# Patient Record
Sex: Male | Born: 1950 | Race: White | Hispanic: No | Marital: Married | Smoking: Never smoker
Health system: Southern US, Community
[De-identification: ages and names within clinical notes are randomized; demographics above are authoritative.]

## PROBLEM LIST (undated history)

## (undated) DIAGNOSIS — K219 Gastro-esophageal reflux disease without esophagitis: Secondary | ICD-10-CM

## (undated) DIAGNOSIS — N189 Chronic kidney disease, unspecified: Secondary | ICD-10-CM

## (undated) DIAGNOSIS — I10 Essential (primary) hypertension: Secondary | ICD-10-CM

## (undated) DIAGNOSIS — H9312 Tinnitus, left ear: Secondary | ICD-10-CM

## (undated) DIAGNOSIS — M1711 Unilateral primary osteoarthritis, right knee: Secondary | ICD-10-CM

## (undated) DIAGNOSIS — M109 Gout, unspecified: Secondary | ICD-10-CM

## (undated) DIAGNOSIS — M1990 Unspecified osteoarthritis, unspecified site: Secondary | ICD-10-CM

## (undated) DIAGNOSIS — E785 Hyperlipidemia, unspecified: Secondary | ICD-10-CM

## (undated) DIAGNOSIS — C8590 Non-Hodgkin lymphoma, unspecified, unspecified site: Secondary | ICD-10-CM

## (undated) HISTORY — DX: Gout, unspecified: M10.9

## (undated) HISTORY — DX: Hyperlipidemia, unspecified: E78.5

## (undated) HISTORY — PX: TOE SURGERY: SHX1073

## (undated) HISTORY — PX: COLONOSCOPY: SHX174

## (undated) HISTORY — DX: Tinnitus, left ear: H93.12

## (undated) HISTORY — PX: MOUTH SURGERY: SHX715

## (undated) HISTORY — PX: JOINT REPLACEMENT: SHX530

---

## 1996-10-31 DIAGNOSIS — C8590 Non-Hodgkin lymphoma, unspecified, unspecified site: Secondary | ICD-10-CM

## 1996-10-31 HISTORY — DX: Non-Hodgkin lymphoma, unspecified, unspecified site: C85.90

## 2002-07-08 ENCOUNTER — Encounter: Payer: Self-pay | Admitting: Family Medicine

## 2002-07-08 ENCOUNTER — Encounter: Admission: RE | Admit: 2002-07-08 | Discharge: 2002-07-08 | Payer: Self-pay | Admitting: Family Medicine

## 2006-10-31 HISTORY — PX: KNEE ARTHROSCOPY: SHX127

## 2008-08-21 ENCOUNTER — Ambulatory Visit (HOSPITAL_BASED_OUTPATIENT_CLINIC_OR_DEPARTMENT_OTHER): Admission: RE | Admit: 2008-08-21 | Discharge: 2008-08-21 | Payer: Self-pay | Admitting: Orthopedic Surgery

## 2011-03-15 NOTE — Op Note (Signed)
[name], [name]              ACCOUNT NO.:  [id number]   MEDICAL RECORD NO.:  [id number]          PATIENT TYPE:  AMB   LOCATION:  DSC                          FACILITY:  [facility]   PHYSICIAN:  [name], M.D.DATE OF BIRTH:  [birth year]   DATE OF PROCEDURE:  08/21/2008  DATE OF DISCHARGE:                               OPERATIVE REPORT   JUSTIFICATION:  A 60 year old male 2-1/2 to 3 weeks ago was exiting from  his car when he planted his foot, twisted his knee.  He had severe pain  along the medial joint line at that time.  He then marched a number of  miles in [employer] reenactment, had increasing pain and discomfort about  his knee.  On examination, he has slight tension on the lateral joint  line, 1+ effusion.  McMurray testing caused the pain over the medial  joint line.  Flexion past about 110 degrees is quite painful.  Weightbearing film shows mild narrowing of the medial joint line.  An  MRI of the knee shows an oblique tear of the posterior horn of the  medial meniscus with associated parameniscal cyst formation and some  underlying moderate medial compartment degenerative changes.  Some  chondromalacia of the patella was also seen.  Because of persistent pain  and discomfort, he is now admitted for arthroscopic evaluation and  treatment.  Complication discussed preoperatively.  Questions answered  and encouraged.   JUSTIFICATION FOR OUTPATIENT SURGERY:  Minimum morbidity.   PREOPERATIVE DIAGNOSIS:  Tear of posterior horn of medial meniscus,  right knee.   POSTOPERATIVE DIAGNOSES:  Grade 2 chondromalacia, patella, medial facet;  grade 2 and grade 3 cartilage damage of medial femoral condyle, right  knee; tear of posterior horn of medial meniscus, right knee.   OPERATION:  Arthroscopy; chondroplasty of medial patellar facet;  chondroplasty of medial femoral condyle; partial debridement of  posterior horn of medial meniscus, right knee.   SURGEON:  [name].  Mortenson, MD   ANESTHESIA:  MAC, general.   PATHOLOGY:  With arthroscope of the knee, a very careful examination was  undertaken.  There is grade 2 cartilage changes of the medial patella  facet.  The femoral notch area.  Normal as did the ACL.  In the lateral  compartment, there was normal articular cartilage of the lateral femoral  condyle, lateral tibial plateau, and entire circumference of lateral  meniscus was normal.  In the medial compartment, there was a tear of the  posterior horn of the medial meniscus and also some grade 2 and grade 3  cartilage damage off the medial femoral condyle and a trough had been  eroded into the posterior aspect of the medial femoral condyle where the  torn meniscus was abutting the condyle.   PROCEDURE:  The patient was placed on the operating table in supine  position with a pneumatic tourniquet above the right upper thigh.  The  entire right lower extremity was prepped with DuraPrep and draped out in  the usual manner.  An infusion cannula was placed in the superior medial  pouch of the right knee.  Anteromedial and anterolateral portals were  made.  The arthroscope was introduced and the findings were described as  above.  Initially, the arthroscope was inserted and the posterior aspect  of the patella was approached.  A chondroplasty shaver was inserted and  the medial facet was debrided.  The ligamentum mucosa was excised and  the fat pad excised.  Excellent access to the medial compartment was  then achieved.  There was marked fraying and tearing of articular  cartilage over the posterior aspect of the medial femoral condyle and  trough was eroded necessary as noted above, and this was debrided with a  chondroplasty shaver.  Then, a series of baskets were inserted through  both medial and lateral portals and a posterior horn of medial meniscus  was debrided.  This was debrided back to a smooth and stable rim.  The  intra-articular shaver was  introduced.  All debris was removed, and  remaining rim was then smoothed and balanced with nice transition of mid  third of the medial meniscus.  I was very pleased with a surgical  outcome.  Marcaine was placed, and a large bulky pressure dressing  applied, and the patient returned to recovery room in excellent  condition.  Technically, this went extremely well.   DISPOSITION:  1. Percocet for pain.  2. Return to my office on Wednesday and start physical therapy.  3. Usual postop instructions were given.      [name], M.D.  Electronically Signed     RAM/MEDQ  D:  08/21/2008  T:  08/21/2008  Job:  [id number]

## 2011-08-02 LAB — BASIC METABOLIC PANEL
BUN: 11
CO2: 28
Calcium: 9.1
Chloride: 104
Creatinine, Ser: 1.28
GFR calc Af Amer: >60
GFR calc non Af Amer: 58 — ABNORMAL LOW
Glucose, Bld: 101 — ABNORMAL HIGH
Potassium: 3.5
Sodium: 139

## 2011-08-02 LAB — POCT HEMOGLOBIN-HEMACUE: Hemoglobin: 13.2

## 2013-05-08 ENCOUNTER — Telehealth: Payer: Self-pay | Admitting: Family Medicine

## 2013-05-09 NOTE — Telephone Encounter (Signed)
Spoke with pt and he will sch appt and will come in fasting.

## 2013-05-24 ENCOUNTER — Encounter: Payer: Self-pay | Admitting: Family Medicine

## 2013-05-24 ENCOUNTER — Ambulatory Visit (INDEPENDENT_AMBULATORY_CARE_PROVIDER_SITE_OTHER): Payer: BC Managed Care – PPO | Admitting: Family Medicine

## 2013-05-24 VITALS — BP 135/86 | HR 70 | Temp 97.9°F | Wt 192.0 lb

## 2013-05-24 DIAGNOSIS — M1710 Unilateral primary osteoarthritis, unspecified knee: Secondary | ICD-10-CM

## 2013-05-24 DIAGNOSIS — E785 Hyperlipidemia, unspecified: Secondary | ICD-10-CM

## 2013-05-24 DIAGNOSIS — M170 Bilateral primary osteoarthritis of knee: Secondary | ICD-10-CM | POA: Insufficient documentation

## 2013-05-24 DIAGNOSIS — M109 Gout, unspecified: Secondary | ICD-10-CM | POA: Insufficient documentation

## 2013-05-24 LAB — COMPLETE METABOLIC PANEL WITH GFR
ALT: 14 U/L (ref 0–53)
AST: 15 U/L (ref 0–37)
Albumin: 3.9 g/dL (ref 3.5–5.2)
Alkaline Phosphatase: 46 U/L (ref 39–117)
BUN: 11 mg/dL (ref 6–23)
CO2: 26 mEq/L (ref 19–32)
Calcium: 9.2 mg/dL (ref 8.4–10.5)
Chloride: 102 mEq/L (ref 96–112)
Creat: 1.16 mg/dL (ref 0.50–1.35)
GFR, Est African American: 78 mL/min
GFR, Est Non African American: 68 mL/min
Glucose, Bld: 85 mg/dL (ref 70–99)
Potassium: 4.1 mEq/L (ref 3.5–5.3)
Sodium: 137 mEq/L (ref 135–145)
Total Bilirubin: 0.6 mg/dL (ref 0.3–1.2)
Total Protein: 6.7 g/dL (ref 6.0–8.3)

## 2013-05-24 LAB — URIC ACID: Uric Acid, Serum: 4.8 mg/dL (ref 4.0–6.0)

## 2013-05-24 MED ORDER — ALLOPURINOL 300 MG PO TABS: 300.0000 mg | ORAL_TABLET | Freq: Every day | ORAL | Status: DC

## 2013-05-24 MED ORDER — DICLOFENAC SODIUM 1 % TD GEL: 2.0000 g | Freq: Four times a day (QID) | TRANSDERMAL | Status: DC

## 2013-05-24 NOTE — Patient Instructions (Addendum)
      Dr Chanell Nadeau's Recommendations  Diet and Exercise discussed with patient.  For nutrition information, I recommend books:  1).Eat to Live by Dr Joel Fuhrman. 2).Prevent and Reverse Heart Disease by Dr Caldwell Esselstyn. 3) Dr Neal Barnard's Book:  Program to Reverse Diabetes  Exercise recommendations are:  If unable to walk, then the patient can exercise in a chair 3 times a day. By flapping arms like a bird gently and raising legs outwards to the front.  If ambulatory, the patient can go for walks for 30 minutes 3 times a week. Then increase the intensity and duration as tolerated.  Goal is to try to attain exercise frequency to 5 times a week.  If applicable: Best to perform resistance exercises (machines or weights) 2 days a week and cardio type exercises 3 days per week.  

## 2013-05-24 NOTE — Progress Notes (Signed)
Patient ID: [name], male   DOB: April 24, 1951, 62 y.o.   MRN: [id number] SUBJECTIVE: CC: Chief Complaint  Patient presents with  . Follow-up    needs labs and refills wants 90 supply  fasting  HPI: Patient is here for follow up of hyperlipidemia/gout: denies Headache;denies Chest Pain;denies weakness;denies Shortness of Breath and orthopnea;denies Visual changes;denies palpitations;denies cough;denies pedal edema;denies symptoms of TIA or stroke;deniesClaudication symptoms. admits to Compliance with medications; denies Problems with medications. Numbness in the thumbs.  Past Medical History  Diagnosis Date  . Gout   . Tinnitus of left ear   . Dyslipidemia    No past surgical history on file. History   Social History  . Marital Status: Single    Spouse Name: N/A    Number of Children: N/A  . Years of Education: N/A   Occupational History  . Not on file.   Social History Main Topics  . Smoking status: Never Smoker   . Smokeless tobacco: Not on file  . Alcohol Use: Not on file  . Drug Use: Not on file  . Sexually Active: Not on file   Other Topics Concern  . Not on file   Social History Narrative  . No narrative on file   No family history on file. No current outpatient prescriptions on file prior to visit.   No current facility-administered medications on file prior to visit.   Allergies  Allergen Reactions  . Indomethacin     ANXIOIUS    There is no immunization history on file for this patient. Prior to Admission medications   Medication Sig Start Date End Date Taking? Authorizing Provider  allopurinol (ZYLOPRIM) 300 MG tablet Take 300 mg by mouth daily.  04/05/13  Yes Historical Provider, MD  lovastatin (MEVACOR) 40 MG tablet Take 40 mg by mouth at bedtime.  03/12/13  Yes Historical Provider, MD     ROS: As above in the HPI. All other systems are stable or negative.  OBJECTIVE: APPEARANCE:  Patient in no acute distress.The patient appeared  well nourished and normally developed. Acyanotic. Waist: VITAL SIGNS:BP 135/86  Pulse 70  Temp(Src) 97.9 F (36.6 C) (Oral)  Wt 192 lb (87.091 kg) WM  SKIN: warm and  Dry without overt rashes, tattoos and scars  HEAD and Neck: without JVD, Head and scalp: normal Eyes:No scleral icterus. Fundi normal, eye movements normal. Ears: Auricle normal, canal normal, Tympanic membranes normal, insufflation normal. Nose: normal Throat: normal Neck & thyroid: normal  CHEST & LUNGS: Chest wall: normal Lungs: Clear  CVS: Reveals the PMI to be normally located. Regular rhythm, First and Second Heart sounds are normal,  absence of murmurs, rubs or gallops. Peripheral vasculature: Radial pulses: normal Dorsal pedis pulses: normal Posterior pulses: normal  ABDOMEN:  Appearance: normal Benign, no organomegaly, no masses, no Abdominal Aortic enlargement. No Guarding , no rebound. No Bruits. Bowel sounds: normal  RECTAL: N/A GU: N/A  EXTREMETIES: nonedematous. Both Femoral and Pedal pulses are normal.  MUSCULOSKELETAL:  Spine: normal Joints: knees crepitus and mild pain.  NEUROLOGIC: oriented to time,place and person; nonfocal. Strength is normal Sensory is normal Reflexes are normal Cranial Nerves are normal.  ASSESSMENT: HLD (hyperlipidemia) - Plan: COMPLETE METABOLIC PANEL WITH GFR, NMR Lipoprofile with Lipids  Gout - Plan: allopurinol (ZYLOPRIM) 300 MG tablet, COMPLETE METABOLIC PANEL WITH GFR, Uric acid  Arthritis knees  PLAN: Orders Placed This Encounter  Procedures  . COMPLETE METABOLIC PANEL WITH GFR  . NMR Lipoprofile with Lipids  .  Uric acid   Meds ordered this encounter  Medications  . DISCONTD: allopurinol (ZYLOPRIM) 300 MG tablet    Sig: Take 300 mg by mouth daily.   Marland Kitchen lovastatin (MEVACOR) 40 MG tablet    Sig: Take 40 mg by mouth at bedtime.   Marland Kitchen allopurinol (ZYLOPRIM) 300 MG tablet    Sig: Take 1 tablet (300 mg total) by mouth daily.    Dispense:   90 tablet    Refill:  3  . diclofenac sodium (VOLTAREN) 1 % GEL    Sig: Apply 2 g topically 4 (four) times daily.    Dispense:  100 g    Refill:  3        Dr Woodroe Mode Recommendations  Diet and Exercise discussed with patient.  For nutrition information, I recommend books:  1).Eat to Live by Dr Monico Hoar. 2).Prevent and Reverse Heart Disease by Dr Suzzette Righter. 3) Dr Katherina Right Book:  Program to Reverse Diabetes  Exercise recommendations are:  If unable to walk, then the patient can exercise in a chair 3 times a day. By flapping arms like a bird gently and raising legs outwards to the front.  If ambulatory, the patient can go for walks for 30 minutes 3 times a week. Then increase the intensity and duration as tolerated.  Goal is to try to attain exercise frequency to 5 times a week.  If applicable: Best to perform resistance exercises (machines or weights) 2 days a week and cardio type exercises 3 days per week.  Return in about 3 months (around 08/24/2013) for Recheck medical problems.  Karanvir Balderston P. Modesto Charon, M.D.

## 2013-05-27 LAB — NMR LIPOPROFILE WITH LIPIDS
Cholesterol, Total: 131 mg/dL (ref <200)
HDL Particle Number: 30.6 umol/L (ref >=30.5)
HDL Size: 8.8 nm — ABNORMAL LOW (ref >=9.2)
HDL-C: 41 mg/dL (ref >=40)
LDL (calc): 73 mg/dL (ref <100)
LDL Particle Number: 998 nmol/L (ref <1000)
LDL Size: 20.3 nm — ABNORMAL LOW (ref >20.5)
LP-IR Score: 37 (ref <=45)
Large HDL-P: 4 umol/L — ABNORMAL LOW (ref >=4.8)
Large VLDL-P: <0.8 nmol/L (ref <=2.7)
Small LDL Particle Number: 504 nmol/L (ref <=527)
Triglycerides: 86 mg/dL (ref <150)
VLDL Size: 43.1 nm (ref <=46.6)

## 2013-05-29 NOTE — Progress Notes (Signed)
Quick Note:  Lab result at goal. No change in Medications for now. No Change in plans and follow up. ______ 

## 2013-07-22 ENCOUNTER — Telehealth: Payer: Self-pay | Admitting: Family Medicine

## 2013-07-22 ENCOUNTER — Other Ambulatory Visit: Payer: Self-pay | Admitting: Family Medicine

## 2013-07-22 DIAGNOSIS — M109 Gout, unspecified: Secondary | ICD-10-CM

## 2013-07-22 MED ORDER — ALLOPURINOL 300 MG PO TABS: 300.0000 mg | ORAL_TABLET | Freq: Every day | ORAL | Status: DC

## 2013-07-22 NOTE — Telephone Encounter (Signed)
Prescription renewed in [facility]IC. 

## 2013-07-22 NOTE — Telephone Encounter (Signed)
Pt requesting a refill of allopurinol.  He is out.

## 2013-07-23 NOTE — Telephone Encounter (Signed)
Pt notified that rx called to [facility]

## 2013-08-29 ENCOUNTER — Ambulatory Visit: Payer: BC Managed Care – PPO | Admitting: Family Medicine

## 2014-01-29 ENCOUNTER — Other Ambulatory Visit: Payer: Self-pay | Admitting: Family Medicine

## 2014-01-29 NOTE — Telephone Encounter (Signed)
Patient needs to be seen. Has exceeded time since last visit. Limited quantity refilled. Needs to bring all medications to next appointment.   

## 2014-01-29 NOTE — Telephone Encounter (Signed)
Last seen 04/2013 

## 2014-01-30 ENCOUNTER — Telehealth: Payer: Self-pay | Admitting: *Deleted

## 2014-01-30 NOTE — Telephone Encounter (Signed)
Appt scheduled for 03/03/14. Can you authorize one refill to last until then?

## 2014-01-30 NOTE — Telephone Encounter (Signed)
Patient aware,NTBS and script ready.

## 2014-02-03 ENCOUNTER — Telehealth: Payer: Self-pay | Admitting: Family Medicine

## 2014-02-04 NOTE — Telephone Encounter (Signed)
SPOKE WITH PT . HE HAS APPT 03-04-14 WITH [facility] AND WANTS TO CHANGE DRUG STORE FROM [facility]. PT REQUESTING HIS ALLOPURINOL BE CALLED THERE. RX CALLED TO [facility] -- PT AWARE

## 2014-02-27 ENCOUNTER — Ambulatory Visit: Payer: BC Managed Care – PPO | Admitting: Family Medicine

## 2014-03-04 ENCOUNTER — Ambulatory Visit: Payer: BC Managed Care – PPO | Admitting: Family Medicine

## 2014-03-04 ENCOUNTER — Telehealth: Payer: Self-pay | Admitting: Family Medicine

## 2014-03-05 NOTE — Telephone Encounter (Signed)
Patient has appt scheduled with Tonita Cong for May 18.  He is out of gout medication and would like a refill to hold him until appt.

## 2014-03-06 ENCOUNTER — Other Ambulatory Visit: Payer: Self-pay | Admitting: Family Medicine

## 2014-03-06 MED ORDER — ALLOPURINOL 300 MG PO TABS: ORAL_TABLET | ORAL | Status: DC

## 2014-03-06 NOTE — Telephone Encounter (Signed)
Call patient : Prescription refilled & sent to pharmacy in [facility]. 

## 2014-03-06 NOTE — Telephone Encounter (Signed)
Aware of script done.

## 2014-03-17 ENCOUNTER — Encounter: Payer: Self-pay | Admitting: Physician Assistant

## 2014-03-17 ENCOUNTER — Ambulatory Visit (INDEPENDENT_AMBULATORY_CARE_PROVIDER_SITE_OTHER): Payer: BC Managed Care – PPO | Admitting: Physician Assistant

## 2014-03-17 VITALS — BP 145/96 | HR 81 | Temp 98.0°F | Ht 71.0 in | Wt 194.6 lb

## 2014-03-17 DIAGNOSIS — E785 Hyperlipidemia, unspecified: Secondary | ICD-10-CM

## 2014-03-17 DIAGNOSIS — M109 Gout, unspecified: Secondary | ICD-10-CM

## 2014-03-17 MED ORDER — ALLOPURINOL 300 MG PO TABS: ORAL_TABLET | ORAL | Status: DC

## 2014-03-17 NOTE — Patient Instructions (Signed)

## 2014-03-17 NOTE — Progress Notes (Signed)
Subjective:     Patient ID: [name], male   DOB: Jul 08, 1951, 63 y.o.   MRN: [id number]  HPI Pt with chronic hx of gout and hyperlipid He has done well with his gout No attacks since starting his Allopurinol He does have some prox thumb pain bilat Pt prev on lipid meds but dc'd due to joint pain He has tried to manage through diet and exercise  Review of Systems Denies any SOB/CP He has lost 15 lbs through diet and exercise No other joint pain at this time     Objective:   Physical Exam No cerv nodes Heart- RRR w/o M Lungs- CTA bilat Abd- soft, NT/ND, no masses/HSM    Assessment:     Gout Hyperlipid    Plan:     BMP, Lipid/liver done today Will inform of lab results Allopurinol was rf'd today Pt to monitor BP Pt to f/u for PE in August for regular health maint

## 2014-03-18 LAB — LIPID PANEL
Chol/HDL Ratio: 4.4 ratio units (ref 0.0–5.0)
Cholesterol, Total: 186 mg/dL (ref 100–199)
HDL: 42 mg/dL (ref >39)
LDL Calculated: 123 mg/dL — ABNORMAL HIGH (ref 0–99)
Triglycerides: 106 mg/dL (ref 0–149)
VLDL Cholesterol Cal: 21 mg/dL (ref 5–40)

## 2014-03-18 LAB — HEPATIC FUNCTION PANEL
ALBUMIN: 4.5 g/dL (ref 3.6–4.8)
ALK PHOS: 48 IU/L (ref 39–117)
ALT: 18 IU/L (ref 0–44)
AST: 19 IU/L (ref 0–40)
BILIRUBIN DIRECT: 0.12 mg/dL (ref 0.00–0.40)
BILIRUBIN TOTAL: 0.5 mg/dL (ref 0.0–1.2)
TOTAL PROTEIN: 6.6 g/dL (ref 6.0–8.5)

## 2014-03-18 LAB — BMP8+EGFR
BUN/Creatinine Ratio: 16 (ref 10–22)
BUN: 20 mg/dL (ref 8–27)
CALCIUM: 9.2 mg/dL (ref 8.6–10.2)
CHLORIDE: 105 mmol/L (ref 97–108)
CO2: 23 mmol/L (ref 18–29)
Creatinine, Ser: 1.27 mg/dL (ref 0.76–1.27)
GFR calc Af Amer: 70 mL/min/{1.73_m2} (ref >59)
GFR calc non Af Amer: 60 mL/min/{1.73_m2} (ref >59)
GLUCOSE: 94 mg/dL (ref 65–99)
POTASSIUM: 4.2 mmol/L (ref 3.5–5.2)
SODIUM: 143 mmol/L (ref 134–144)

## 2014-03-27 ENCOUNTER — Telehealth: Payer: Self-pay | Admitting: Physician Assistant

## 2014-04-01 MED ORDER — ALLOPURINOL 300 MG PO TABS: ORAL_TABLET | ORAL | Status: DC

## 2014-04-01 NOTE — Telephone Encounter (Signed)
rx snet in but only 6 months worth of refill

## 2014-05-12 ENCOUNTER — Telehealth: Payer: Self-pay | Admitting: Physician Assistant

## 2014-05-12 NOTE — Telephone Encounter (Signed)
Discussed with patient. Appt scheduled with Dr. Sabra Heck for 05/16/14.

## 2014-05-12 NOTE — Telephone Encounter (Signed)
Uric acid level was normal- not sure was gout- NTBS

## 2014-05-12 NOTE — Telephone Encounter (Signed)
Pt having bilateral hand pain. Was told it was gout but Allopurinol not helping. Tylenol not helping. Wants something for pain or what to do next. Having a hard time grasping with thumb. [employer], [place].

## 2014-05-16 ENCOUNTER — Encounter: Payer: Self-pay | Admitting: Family Medicine

## 2014-05-16 ENCOUNTER — Ambulatory Visit (INDEPENDENT_AMBULATORY_CARE_PROVIDER_SITE_OTHER): Payer: BC Managed Care – PPO | Admitting: Family Medicine

## 2014-05-16 VITALS — BP 130/77 | HR 81 | Temp 97.3°F | Ht 71.0 in | Wt 192.6 lb

## 2014-05-16 DIAGNOSIS — M654 Radial styloid tenosynovitis [de Quervain]: Secondary | ICD-10-CM

## 2014-05-16 MED ORDER — DICLOFENAC SODIUM 75 MG PO TBEC: 75.0000 mg | DELAYED_RELEASE_TABLET | Freq: Two times a day (BID) | ORAL | Status: DC

## 2014-05-16 NOTE — Progress Notes (Signed)
   Subjective:    Patient ID: [name], male    DOB: Aug 27, 1951, 63 y.o.   MRN: [id number]  HPI is a 63 year old male here today with bilateral thumb pain. 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 He has a history of gout and wonders if a gallop could be because of his thumb pain. There is been no erythema or increased temperature in the area. There is no history of injury He has not had a flareup of gout since he was placed on allopurinol sometime in the past. He has a prescription for diclofenac but has not tried that for his thumb pain    Review of Systems  Constitutional: Negative.   HENT: Negative.   Eyes: Negative.   Respiratory: Negative.   Gastrointestinal: Negative.   Endocrine: Negative.   Genitourinary: Negative.   Musculoskeletal: Negative.   Neurological: Negative.   Hematological: Negative.   Psychiatric/Behavioral: Negative.        Objective:   Physical Exam  Musculoskeletal:  There is no deformity of the thumbs nor is there swelling. There is general tenderness at the base of each thumb in the area of the extensor tendons or anatomic snuffbox. Phelan and Finkelstein's tests are unremarkable          Assessment & Plan:   Thumb pain. He I suspect this pain is a variation of radial tenosynovitis or Tennis Must Quervain's this may require a thumb spica splints but he wanted to try the diclofenac that he arty hands for one week prior to his use of splints. I did show him how he could take the thumbs to splint him and he may or may not try this

## 2014-05-16 NOTE — Patient Instructions (Signed)
De Quervain's Disease °De Quervain's disease is a condition often seen in racquet sports where there is a soreness (inflammation) in the cord like structures (tendons) which attach muscle to bone on the thumb side of the wrist. There may be a tightening of the tissuesaround the tendons. This condition is often helped by giving up or modifying the activity which caused it. When conservative treatment does not help, surgery may be required. Conservative treatment could include changes in the activity which brought about the problem or made it worse. Anti-inflammatory medications and injections may be used to help decrease the inflammation and help with pain control. Your caregiver will help you determine which is best for you. °DIAGNOSIS  °Often the diagnosis (learning what is wrong) can be made by examination. Sometimes x-rays are required. °HOME CARE INSTRUCTIONS  °· Apply ice to the sore area for 15-20 minutes, 03-04 times per day while awake. Put the ice in a plastic bag and place a towel between the bag of ice and your skin. This is especially helpful if it can be done after all activities involving the sore wrist. °· Temporary splinting may help. °· Only take over-the-counter or prescription medicines for pain, discomfort or fever as directed by your caregiver. °SEEK MEDICAL CARE IF:  °· Pain relief is not obtained with medications, or if you have increasing pain and seem to be getting worse rather than better. °MAKE SURE YOU:  °· Understand these instructions. °· Will watch your condition. °· Will get help right away if you are not doing well or get worse. °Document Released: 07/12/2001 Document Revised: 01/09/2012 Document Reviewed: 10/17/2005 °ExitCare® Patient Information ©2015 [employer]. This information is not intended to replace advice given to you by your health care provider. Make sure you discuss any questions you have with your health care provider. ° °

## 2014-10-14 ENCOUNTER — Other Ambulatory Visit: Payer: Self-pay | Admitting: Nurse Practitioner

## 2014-11-04 ENCOUNTER — Telehealth: Payer: Self-pay | Admitting: Family Medicine

## 2014-11-04 NOTE — Telephone Encounter (Signed)
Advised to practice standard practices like hand washing,  disinfecting  and clorox wipes, etc.  Refrain from extreme close contact.

## 2014-11-12 ENCOUNTER — Other Ambulatory Visit: Payer: Self-pay | Admitting: Family Medicine

## 2014-11-13 ENCOUNTER — Other Ambulatory Visit: Payer: Self-pay | Admitting: Family Medicine

## 2014-11-13 NOTE — Telephone Encounter (Signed)
Appointment given for tomorrow at [place] with [facility].

## 2014-11-14 ENCOUNTER — Ambulatory Visit: Payer: Self-pay | Admitting: Family Medicine

## 2014-12-05 ENCOUNTER — Ambulatory Visit: Payer: Self-pay | Admitting: Family Medicine

## 2014-12-05 ENCOUNTER — Other Ambulatory Visit: Payer: Self-pay | Admitting: *Deleted

## 2014-12-05 MED ORDER — ALLOPURINOL 300 MG PO TABS: 300.0000 mg | ORAL_TABLET | Freq: Every day | ORAL | Status: DC

## 2014-12-05 NOTE — Progress Notes (Signed)
Pt cancelled appt in error appt rescheduled Med ok per WDS x 1

## 2014-12-11 ENCOUNTER — Other Ambulatory Visit: Payer: Self-pay | Admitting: Family Medicine

## 2014-12-11 MED ORDER — ALLOPURINOL 300 MG PO TABS: 300.0000 mg | ORAL_TABLET | Freq: Every day | ORAL | Status: DC

## 2014-12-11 NOTE — Telephone Encounter (Signed)
done

## 2014-12-29 ENCOUNTER — Telehealth: Payer: Self-pay | Admitting: Family Medicine

## 2014-12-29 NOTE — Telephone Encounter (Signed)
Last uric acid level was drawn 04/2013.  Is it ok to refill Allopurinol for 90 day supply. Patient won't have insurance coverage after today.

## 2014-12-30 MED ORDER — ALLOPURINOL 300 MG PO TABS: 300.0000 mg | ORAL_TABLET | Freq: Every day | ORAL | Status: DC

## 2014-12-30 NOTE — Telephone Encounter (Signed)
Pt aware.

## 2015-01-15 ENCOUNTER — Ambulatory Visit (INDEPENDENT_AMBULATORY_CARE_PROVIDER_SITE_OTHER): Payer: Self-pay | Admitting: Family Medicine

## 2015-01-15 ENCOUNTER — Encounter: Payer: Self-pay | Admitting: Family Medicine

## 2015-01-15 ENCOUNTER — Encounter (INDEPENDENT_AMBULATORY_CARE_PROVIDER_SITE_OTHER): Payer: Self-pay

## 2015-01-15 VITALS — BP 138/88 | HR 85 | Temp 97.3°F | Ht 71.0 in | Wt 201.0 lb

## 2015-01-15 DIAGNOSIS — M25561 Pain in right knee: Secondary | ICD-10-CM

## 2015-01-15 DIAGNOSIS — Z1211 Encounter for screening for malignant neoplasm of colon: Secondary | ICD-10-CM

## 2015-01-15 MED ORDER — ALLOPURINOL 300 MG PO TABS: 300.0000 mg | ORAL_TABLET | Freq: Every day | ORAL | Status: DC

## 2015-01-15 NOTE — Progress Notes (Signed)
   Subjective:    Patient ID: [name], male    DOB: 15-Apr-1951, 64 y.o.   MRN: [id number]  HPI  64 year old gentleman who complains today of pain in his right knee and right lateral upper leg. He believes that he is compensating for knee pain by changing his gait and that internal causes the pain in the area of the greater trochanter. I am inclined to agree with this assessment.  Patient Active Problem List   Diagnosis Date Noted  . Arthritis of both knees 05/24/2013  . Gout 05/24/2013  . HLD (hyperlipidemia) 05/24/2013   Outpatient Encounter Prescriptions as of 01/15/2015  Medication Sig  . allopurinol (ZYLOPRIM) 300 MG tablet Take 1 tablet (300 mg total) by mouth daily.  . [DISCONTINUED] diclofenac (VOLTAREN) 75 MG EC tablet Take 1 tablet (75 mg total) by mouth 2 (two) times daily.  . [DISCONTINUED] diclofenac sodium (VOLTAREN) 1 % GEL Apply 2 g topically 4 (four) times daily.      Review of Systems  Constitutional: Negative.   HENT: Negative.   Respiratory: Negative.   Cardiovascular: Negative.   Gastrointestinal: Negative.   Musculoskeletal: Positive for arthralgias.  Psychiatric/Behavioral: Negative.        Objective:   Physical Exam  Constitutional: He appears well-developed and well-nourished.  Cardiovascular: Normal rate and regular rhythm.   Pulmonary/Chest: Effort normal.  Musculoskeletal:  Right knee shows some effusion but there is no joint line tenderness or instability to stress test.  There is tenderness in the area of the greater trochanter but again I think this relates to the knee pain and alteration of gait. Point of maximal tenderness was injected with combination of Marcaine and Depo-Medrol.  Neurological: He is alert.    BP 138/88 mmHg  Pulse 85  Temp(Src) 97.3 F (36.3 C) (Oral)  Ht 5\' 11"  (1.803 m)  Wt 201 lb (91.173 kg)  BMI 28.05 kg/m2       Assessment & Plan:  1. Screening for colon cancer  - Ambulatory referral to  Gastroenterology  2. Knee pain, acute, right Suspect there are degenerative changes within the joint. We talked about some quad exercises and use of anti-inflammatories  Wardell Honour MD

## 2015-02-20 ENCOUNTER — Encounter: Payer: Self-pay | Admitting: Gastroenterology

## 2015-04-23 ENCOUNTER — Other Ambulatory Visit: Payer: Self-pay | Admitting: Family Medicine

## 2015-04-23 ENCOUNTER — Telehealth: Payer: Self-pay | Admitting: Family Medicine

## 2015-04-29 ENCOUNTER — Other Ambulatory Visit: Payer: Self-pay | Admitting: Family Medicine

## 2015-04-29 MED ORDER — ALLOPURINOL 300 MG PO TABS: 300.0000 mg | ORAL_TABLET | Freq: Every day | ORAL | Status: DC

## 2015-04-29 NOTE — Telephone Encounter (Signed)
Pt notified & Rx sent in to pharmacy 

## 2015-10-09 ENCOUNTER — Encounter: Payer: Self-pay | Admitting: Family Medicine

## 2015-10-09 ENCOUNTER — Ambulatory Visit (INDEPENDENT_AMBULATORY_CARE_PROVIDER_SITE_OTHER): Payer: Self-pay | Admitting: Family Medicine

## 2015-10-09 VITALS — BP 136/83 | HR 81 | Temp 97.4°F | Ht 71.0 in | Wt 196.2 lb

## 2015-10-09 DIAGNOSIS — M25551 Pain in right hip: Secondary | ICD-10-CM

## 2015-10-09 DIAGNOSIS — Z23 Encounter for immunization: Secondary | ICD-10-CM

## 2015-10-09 DIAGNOSIS — E785 Hyperlipidemia, unspecified: Secondary | ICD-10-CM

## 2015-10-09 MED ORDER — ALLOPURINOL 300 MG PO TABS: 300.0000 mg | ORAL_TABLET | Freq: Every day | ORAL | Status: DC

## 2015-10-09 NOTE — Progress Notes (Signed)
   Subjective:    Patient ID: [name], male    DOB: Mar 07, 1951, 64 y.o.   MRN: [id number]  HPI patient presents today for a physical. He has several symptoms. 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 This was treated at [facility] several years ago.  He also complains of some pain in his ankles. As we talked about the problem is sounds like this may be related to gait and I have suggested some foot orthotics depending on the aware of his shoe. He has on new shoes today so it's not possible to make a real judgment but it sounds like he is walking on the inside of his shoes. He does have a history of gout and I cannot rule out some chronic gout as a source of his ankle pain either. He takes allopurinol and has not really had a full-blown gout attack since being on it.  Patient Active Problem List   Diagnosis Date Noted  . Arthritis of both knees 05/24/2013  . Gout 05/24/2013  . HLD (hyperlipidemia) 05/24/2013   Outpatient Encounter Prescriptions as of 10/09/2015  Medication Sig  . allopurinol (ZYLOPRIM) 300 MG tablet Take 1 tablet (300 mg total) by mouth daily.   No facility-administered encounter medications on file as of 10/09/2015.      Review of Systems  Constitutional: Negative.   Respiratory: Negative.   Cardiovascular: Positive for chest pain.  Genitourinary: Negative.   Musculoskeletal: Positive for arthralgias.  Neurological: Negative.        Objective:   Physical Exam  Constitutional: He is oriented to person, place, and time. He appears well-developed and well-nourished.  HENT:  Head: Normocephalic.  Cardiovascular: Normal rate, regular rhythm and normal heart sounds.   Pulmonary/Chest: Effort normal and breath sounds normal.  Abdominal: Soft. There is no tenderness.  Genitourinary: Prostate normal.  Musculoskeletal:  There is tenderness in the right great trochanter. This was previously injected with some  success. After prepping the skin with alcohol area of maximal tenderness was injected again with combination Marcaine and Depo-Medrol which she tolerated well.  Neurological: He is alert and oriented to person, place, and time.  Psychiatric: He has a normal mood and affect. His behavior is normal.          Assessment & Plan:  1. HLD (hyperlipidemia) Patient has not had blood work in 5 years will screen for lipids and sugar as well as do PSA - CMP14+EGFR - Lipid panel - PSA, total and free  2. Encounter for immunization Flu shot given

## 2015-10-10 LAB — PSA, TOTAL AND FREE
PROSTATE SPECIFIC AG, SERUM: 1.8 ng/mL (ref 0.0–4.0)
PSA FREE: 0.54 ng/mL
PSA, Free Pct: 30 %

## 2015-10-10 LAB — LIPID PANEL
Chol/HDL Ratio: 4.3 ratio units (ref 0.0–5.0)
Cholesterol, Total: 194 mg/dL (ref 100–199)
HDL: 45 mg/dL (ref >39)
LDL CALC: 136 mg/dL — AB (ref 0–99)
Triglycerides: 67 mg/dL (ref 0–149)
VLDL CHOLESTEROL CAL: 13 mg/dL (ref 5–40)

## 2015-10-10 LAB — CMP14+EGFR
ALBUMIN: 4.1 g/dL (ref 3.6–4.8)
ALT: 16 IU/L (ref 0–44)
AST: 14 IU/L (ref 0–40)
Albumin/Globulin Ratio: 1.7 (ref 1.1–2.5)
Alkaline Phosphatase: 50 IU/L (ref 39–117)
BILIRUBIN TOTAL: 0.5 mg/dL (ref 0.0–1.2)
BUN / CREAT RATIO: 14 (ref 10–22)
BUN: 15 mg/dL (ref 8–27)
CHLORIDE: 102 mmol/L (ref 97–106)
CO2: 25 mmol/L (ref 18–29)
CREATININE: 1.11 mg/dL (ref 0.76–1.27)
Calcium: 9 mg/dL (ref 8.6–10.2)
GFR calc non Af Amer: 70 mL/min/{1.73_m2} (ref >59)
GFR, EST AFRICAN AMERICAN: 81 mL/min/{1.73_m2} (ref >59)
GLOBULIN, TOTAL: 2.4 g/dL (ref 1.5–4.5)
GLUCOSE: 101 mg/dL — AB (ref 65–99)
Potassium: 4.4 mmol/L (ref 3.5–5.2)
SODIUM: 140 mmol/L (ref 136–144)
TOTAL PROTEIN: 6.5 g/dL (ref 6.0–8.5)

## 2015-10-23 ENCOUNTER — Telehealth: Payer: Self-pay | Admitting: Family Medicine

## 2015-10-23 MED ORDER — ALLOPURINOL 300 MG PO TABS: 300.0000 mg | ORAL_TABLET | Freq: Every day | ORAL | Status: DC

## 2015-10-23 NOTE — Telephone Encounter (Signed)
Stp and he says you were going to give him something for acid reflux, i don't see any mention of that in your notes. Pt knows you're out of the office until Tuesday.

## 2015-10-27 NOTE — Telephone Encounter (Signed)
We could try Protonix 40 mg daily for reflux #30

## 2015-11-04 MED ORDER — PANTOPRAZOLE SODIUM 40 MG PO TBEC: 40.0000 mg | DELAYED_RELEASE_TABLET | Freq: Every day | ORAL | Status: DC

## 2015-11-04 NOTE — Telephone Encounter (Signed)
Left detailed message that rx has been sent to pharmacy and to [facility] with any further questions and concerns.

## 2016-01-15 ENCOUNTER — Ambulatory Visit: Payer: Self-pay | Admitting: Family Medicine

## 2016-04-08 ENCOUNTER — Ambulatory Visit: Payer: Self-pay | Admitting: Family Medicine

## 2016-04-19 ENCOUNTER — Ambulatory Visit: Payer: Self-pay | Admitting: Family Medicine

## 2016-05-24 ENCOUNTER — Other Ambulatory Visit: Payer: Self-pay | Admitting: Family Medicine

## 2016-05-24 MED ORDER — ALLOPURINOL 300 MG PO TABS: 300.0000 mg | ORAL_TABLET | Freq: Every day | ORAL | 1 refills | Status: DC

## 2016-05-24 NOTE — Telephone Encounter (Signed)
Medication sent to pharmacy  

## 2016-06-15 ENCOUNTER — Ambulatory Visit: Payer: Self-pay | Admitting: Family Medicine

## 2016-06-16 ENCOUNTER — Encounter: Payer: Self-pay | Admitting: Family Medicine

## 2016-10-04 ENCOUNTER — Encounter: Payer: Self-pay | Admitting: Family Medicine

## 2016-10-04 ENCOUNTER — Ambulatory Visit (INDEPENDENT_AMBULATORY_CARE_PROVIDER_SITE_OTHER): Payer: Medicare Other

## 2016-10-04 ENCOUNTER — Ambulatory Visit (INDEPENDENT_AMBULATORY_CARE_PROVIDER_SITE_OTHER): Payer: Medicare Other | Admitting: Family Medicine

## 2016-10-04 VITALS — BP 128/80 | HR 75 | Temp 97.1°F | Ht 71.0 in | Wt 199.6 lb

## 2016-10-04 DIAGNOSIS — M109 Gout, unspecified: Secondary | ICD-10-CM

## 2016-10-04 DIAGNOSIS — R053 Chronic cough: Secondary | ICD-10-CM

## 2016-10-04 DIAGNOSIS — E785 Hyperlipidemia, unspecified: Secondary | ICD-10-CM | POA: Diagnosis not present

## 2016-10-04 DIAGNOSIS — R05 Cough: Secondary | ICD-10-CM

## 2016-10-04 DIAGNOSIS — Z23 Encounter for immunization: Secondary | ICD-10-CM | POA: Diagnosis not present

## 2016-10-04 MED ORDER — ALLOPURINOL 300 MG PO TABS: 300.0000 mg | ORAL_TABLET | Freq: Every day | ORAL | 3 refills | Status: DC

## 2016-10-04 NOTE — Patient Instructions (Signed)
Great to meet you!  Try the flonase, We will call with x ray and labs within 1 week

## 2016-10-04 NOTE — Progress Notes (Signed)
   HPI  Patient presents today here for six-month follow-up with chronic cough.  Patient is fasting today.   Describes "hacking" cough for years off and on. He states over the last few months he started developing burning in the central chest with cough. He's tried Protonix for 3-4 months without improvement. 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  Gout- doing well without flare, good med compliance  HLD On prava, good compliance without SE  PMH: Smoking status noted ROS: Per HPI  Objective: BP 128/80   Pulse 75   Temp 97.1 F (36.2 C) (Oral)   Ht '5\' 11"'$  (1.803 m)   Wt 199 lb 9.6 oz (90.5 kg)   BMI 27.84 kg/m  Gen: NAD, alert, cooperative with exam HEENT: NCAT, EOMI, PERRL CV: RRR, good S1/S2, no murmur Resp: CTABL, no wheezes, non-labored- inspiratory crackles @ BL Bases X 2-3 breaths that then resolved Ext: No edema, warm Neuro: Alert and oriented, No gross deficits  Assessment and plan:  # Chronic cough Unclear etiology, however he's been treated adequately for GERD related cough for 3 months of PPI so I believe this is unlikely to be the source Consider postnasal drip as next most likely cause, start Flonase, follow-up 6-8 weeks Considering history of non-Hodgkin lymphoma and 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 Chest x-ray today Consider pulmonology referral for PFTs as well  # Gout Well controlled on allopurinol Repeat uric acid Refilled allopurinol  # Hyperlipidemia Rechecking labs today Continue pravastatin  # Encounter for immunization Influenza vaccine given today, counseling provided for all vaccine components     Orders Placed This Encounter  Procedures  . DG Chest 2 View    Standing Status:   Future    Standing Expiration Date:   12/05/2017    Order Specific Question:   Reason for Exam (SYMPTOM  OR DIAGNOSIS  REQUIRED)    Answer:   chronic cough    Order Specific Question:   Preferred imaging location?    Answer:   Internal  . CMP14+EGFR  . CBC with Differential/Platelet  . Lipid panel  . Uric acid    Meds ordered this encounter  Medications  . allopurinol (ZYLOPRIM) 300 MG tablet    Sig: Take 1 tablet (300 mg total) by mouth daily.    Dispense:  90 tablet    Refill:  [place], MD [facility] 10/04/2016, 11:01 AM

## 2016-10-05 LAB — LIPID PANEL
CHOL/HDL RATIO: 5 ratio (ref 0.0–5.0)
Cholesterol, Total: 209 mg/dL — ABNORMAL HIGH (ref 100–199)
HDL: 42 mg/dL (ref >39)
LDL CALC: 143 mg/dL — AB (ref 0–99)
TRIGLYCERIDES: 120 mg/dL (ref 0–149)
VLDL Cholesterol Cal: 24 mg/dL (ref 5–40)

## 2016-10-05 LAB — CMP14+EGFR
ALT: 19 IU/L (ref 0–44)
AST: 12 IU/L (ref 0–40)
Albumin/Globulin Ratio: 1.9 (ref 1.2–2.2)
Albumin: 4.5 g/dL (ref 3.6–4.8)
Alkaline Phosphatase: 49 IU/L (ref 39–117)
BUN/Creatinine Ratio: 11 (ref 10–24)
BUN: 13 mg/dL (ref 8–27)
Bilirubin Total: 0.5 mg/dL (ref 0.0–1.2)
CALCIUM: 9.1 mg/dL (ref 8.6–10.2)
CO2: 25 mmol/L (ref 18–29)
Chloride: 99 mmol/L (ref 96–106)
Creatinine, Ser: 1.18 mg/dL (ref 0.76–1.27)
GFR, EST AFRICAN AMERICAN: 75 mL/min/{1.73_m2} (ref >59)
GFR, EST NON AFRICAN AMERICAN: 65 mL/min/{1.73_m2} (ref >59)
GLUCOSE: 95 mg/dL (ref 65–99)
Globulin, Total: 2.4 g/dL (ref 1.5–4.5)
Potassium: 4.5 mmol/L (ref 3.5–5.2)
Sodium: 139 mmol/L (ref 134–144)
TOTAL PROTEIN: 6.9 g/dL (ref 6.0–8.5)

## 2016-10-05 LAB — CBC WITH DIFFERENTIAL/PLATELET
BASOS: 1 %
Basophils Absolute: 0 10*3/uL (ref 0.0–0.2)
EOS (ABSOLUTE): 0.2 10*3/uL (ref 0.0–0.4)
EOS: 3 %
HEMATOCRIT: 45.7 % (ref 37.5–51.0)
HEMOGLOBIN: 15.8 g/dL (ref 13.0–17.7)
Immature Grans (Abs): 0 10*3/uL (ref 0.0–0.1)
Immature Granulocytes: 1 %
LYMPHS ABS: 1.2 10*3/uL (ref 0.7–3.1)
Lymphs: 19 %
MCH: 31.1 pg (ref 26.6–33.0)
MCHC: 34.6 g/dL (ref 31.5–35.7)
MCV: 90 fL (ref 79–97)
MONOCYTES: 10 %
Monocytes Absolute: 0.6 10*3/uL (ref 0.1–0.9)
NEUTROS ABS: 4.2 10*3/uL (ref 1.4–7.0)
Neutrophils: 66 %
Platelets: 270 10*3/uL (ref 150–379)
RBC: 5.08 x10E6/uL (ref 4.14–5.80)
RDW: 13.5 % (ref 12.3–15.4)
WBC: 6.3 10*3/uL (ref 3.4–10.8)

## 2016-10-05 LAB — URIC ACID: Uric Acid: 5.1 mg/dL (ref 3.7–8.6)

## 2016-11-12 ENCOUNTER — Other Ambulatory Visit: Payer: Self-pay | Admitting: Family Medicine

## 2016-11-17 ENCOUNTER — Ambulatory Visit: Payer: Medicare Other | Admitting: Family Medicine

## 2016-11-21 ENCOUNTER — Ambulatory Visit (INDEPENDENT_AMBULATORY_CARE_PROVIDER_SITE_OTHER): Payer: Medicare Other | Admitting: Family Medicine

## 2016-11-21 ENCOUNTER — Encounter: Payer: Self-pay | Admitting: Family Medicine

## 2016-11-21 VITALS — BP 129/89 | HR 78 | Temp 97.1°F | Ht 71.0 in | Wt 198.8 lb

## 2016-11-21 DIAGNOSIS — R053 Chronic cough: Secondary | ICD-10-CM

## 2016-11-21 DIAGNOSIS — E785 Hyperlipidemia, unspecified: Secondary | ICD-10-CM

## 2016-11-21 DIAGNOSIS — R05 Cough: Secondary | ICD-10-CM | POA: Diagnosis not present

## 2016-11-21 DIAGNOSIS — M109 Gout, unspecified: Secondary | ICD-10-CM

## 2016-11-21 NOTE — Patient Instructions (Signed)
Great to see you!  Consider statins for high cholesterol.   We will work on a referral for a lung doctor for you.

## 2016-11-21 NOTE — Progress Notes (Signed)
   HPI  Patient presents today follow-up for chronic cough, hyperlipidemia, and gout.  Gout Asymptomatic, started after chemotherapy for non-Hodgkin's lymphoma. Has been on stable dose of allopurinol for years. Previous uric acid 5.1 one month ago.  Hyperlipidemia Previously stopped lovastatin due to arthralgia, arthralgias have persisted after stopping medication. Family history of CVA at age is greater than 17 No history of heart disease.  Chronic cough Patient complains of "tickle-like" cough in the back of his throat that did not improve with regular Flonase use. He has small amount of sputum production throughout the day but it's take her in the morning. Patient has a history of non-Hodgkin lymphoma as well as a spasticity exposure, so he would like aggressive evaluation of this cough. Denies any shortness of breath. He does have some associated burning type chest pain bilaterally in his chest. His symptoms are nonexertional.    PMH: Smoking status noted ROS: Per HPI  Objective: BP 129/89   Pulse 78   Temp 97.1 F (36.2 C) (Oral)   Ht 5\' 11"  (1.803 m)   Wt 198 lb 12.8 oz (90.2 kg)   BMI 27.73 kg/m  Gen: NAD, alert, cooperative with exam HEENT: NCAT CV: RRR, good S1/S2, no murmur Resp: CTABL, no wheezes, non-labored Ext: No edema, warm Neuro: Alert and oriented, No gross deficits  Assessment and plan:  # Chronic cough With history of non-Hodgkin lymphoma and asbestos exposure with no improvement after 3 months of PPI and 6 weeks of intranasal corticosteroids and recommended follow-up pulmonology. Referral to pulmonology written The cough does sound characteristic of postnasal drip, however given her other risk factors I think this is an appropriate referral, appreciate their recommendations.  # Hyperlipidemia Patient with 13.7% 10 year ASCVD risk, recommended starting statin today Discussed pros and cons of several statins, patient will consider and make his  choice. Consider Lipitor 20 mg, livalo if coverage is good, simvastatin.   # Gout Asymptomatic, patient's considering stopping allopurinol as his symptoms were mild in the 90s when he started it, and he was undergoing chemotherapy at bedtime. Previous uric acid was very well controlled at 5.1     Orders Placed This Encounter  Procedures  . Ambulatory referral to Pulmonology    Referral Priority:   Routine    Referral Type:   Consultation    Referral Reason:   Specialty Services Required    Requested Specialty:   Pulmonary Disease    Number of Visits Requested:   1    No orders of the defined types were placed in this encounter.   Laroy Apple, MD [facility] 11/21/2016, 9:39 AM

## 2016-11-28 ENCOUNTER — Encounter: Payer: Self-pay | Admitting: Pulmonary Disease

## 2016-11-28 ENCOUNTER — Ambulatory Visit (INDEPENDENT_AMBULATORY_CARE_PROVIDER_SITE_OTHER): Payer: Medicare Other | Admitting: Pulmonary Disease

## 2016-11-28 DIAGNOSIS — R053 Chronic cough: Secondary | ICD-10-CM

## 2016-11-28 DIAGNOSIS — R05 Cough: Secondary | ICD-10-CM | POA: Diagnosis not present

## 2016-11-28 MED ORDER — PANTOPRAZOLE SODIUM 40 MG PO TBEC: DELAYED_RELEASE_TABLET | ORAL | 3 refills | Status: DC

## 2016-11-28 NOTE — Progress Notes (Signed)
Subjective:    Patient ID: [name], male    DOB: [birth year], 66 y.o.   MRN: [id number]  HPI  Chief Complaint  Patient presents with  . Pulm Consult    Chronic cough for the 3-4 months. Burning sensation while coughing. Semi-productive cough with green mucus. Mucus is thicker in the mornings.     66 year old never smoker presents for evaluation of chronic cough. He reports cough of insidious onset about 3 months ago, reports burning in his upper chest start stop sternal area radiating to his back. He notes a lump in his throat, cough does not wake him up from sleep and does not have diurnal or seasonal variation. He reports continuous sniffling for a few months but again denies allergies or seasonal variations. He was given a trial of PPI for reflux and Flonase for nasal allergies without significant improvement hence referred to [place]. Chest x-ray December 2017 showed mild lingular scarring.  He reports a history of non-Hodgkin's lymphoma diagnosed in 1998 for which she was treated with chemotherapy with remission, since then he reports intermittent heartburn for which she is taking Tums over-the-counter.  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 He is concerned about exposure to asbestos He denies significant postnasal drip Medication review only shows allopurinol    Past Medical History:  Diagnosis Date  . Dyslipidemia   . Gout   . Tinnitus of left ear      Past Surgical History:  Procedure Laterality Date  . KNEE ARTHROSCOPY Right 10/31/06  . TOE SURGERY Bilateral    Great toe each foot from athletic injuries    Allergies  Allergen Reactions  . Indomethacin     Disoriented      Social History   Social History  . Marital status: Single    Spouse name: N/A  . Number of children: N/A  . Years of education: N/A   Occupational History  . Not on file.   Social History Main Topics  . Smoking status: Never Smoker  . Smokeless tobacco:  Never Used  . Alcohol use 1.2 oz/week    2 Cans of beer per week  . Drug use: No  . Sexual activity: Not on file   Other Topics Concern  . Not on file   Social History Narrative  . No narrative on file     Family History  Problem Relation Age of Onset  . Diabetes Mother   . Diabetes Maternal Grandmother      Review of Systems  Constitutional: Negative for fever and unexpected weight change.  HENT: Positive for congestion and sneezing. Negative for dental problem, ear pain, nosebleeds, postnasal drip, rhinorrhea, sinus pressure, sore throat and trouble swallowing.   Eyes: Negative for redness and itching.  Respiratory: Positive for cough and shortness of breath. Negative for chest tightness and wheezing.   Cardiovascular: Negative for palpitations and leg swelling.  Gastrointestinal: Negative for nausea and vomiting.  Genitourinary: Negative for dysuria.  Musculoskeletal: Negative for joint swelling.  Skin: Negative for rash.  Neurological: Negative for headaches.  Hematological: Does not bruise/bleed easily.  Psychiatric/Behavioral: Negative for dysphoric mood. The patient is not nervous/anxious.        Objective:   Physical Exam   Gen. Pleasant, well-nourished, in no distress, normal affect ENT - no lesions, no post nasal drip Neck: No JVD, no thyromegaly, no carotid bruits Lungs: no use of accessory muscles, no dullness to percussion, clear without rales or rhonchi  Cardiovascular: Rhythm regular,  heart sounds  normal, no murmurs or gallops, no peripheral edema Abdomen: soft and non-tender, no hepatosplenomegaly, BS normal. Musculoskeletal: No deformities, no cyanosis or clubbing Neuro:  alert, non focal        Assessment & Plan:

## 2016-11-28 NOTE — Patient Instructions (Signed)
We will undertake treatment trial for postnasal drip Take chlorpheniramine 4 mg twice daily (storebrand okay] -If this makes you sleepy, he can only take the night dose  Take Sudafed 60 mg daily for 4 weeks  Prescription for Protonix 40 mg after dinner daily  Call us to report in 4 weeks

## 2016-11-28 NOTE — Assessment & Plan Note (Signed)
Differential diagnosis in this never smoker with clear chest x-ray includes upper airway cough syndrome and reflux-he has not responded to empiric trial for reflux with a PPI but does report some burning  We will undertake treatment trial for postnasal drip Take chlorpheniramine 4 mg twice daily (storebrand okay] -If this makes you sleepy, he can only take the night dose  Take Sudafed 60 mg daily for 4 weeks  Prescription for Protonix 40 mg after dinner daily  Call us to report in 4 weeks

## 2016-12-15 ENCOUNTER — Ambulatory Visit: Payer: Medicare Other | Admitting: Family Medicine

## 2016-12-19 ENCOUNTER — Telehealth: Payer: Self-pay | Admitting: Family Medicine

## 2016-12-19 MED ORDER — PITAVASTATIN CALCIUM 1 MG PO TABS: 1.0000 mg | ORAL_TABLET | Freq: Every day | ORAL | 2 refills | Status: DC

## 2016-12-19 NOTE — Addendum Note (Signed)
Addended by: Timmothy Euler on: 12/19/2016 04:31 PM   Modules accepted: Orders

## 2016-12-19 NOTE — Telephone Encounter (Signed)
Livalo is the most glucose neutral statin.   Laroy Apple, MD [facility] 12/19/2016, 4:30 PM

## 2016-12-19 NOTE — Telephone Encounter (Signed)
Pt aware.

## 2016-12-19 NOTE — Telephone Encounter (Signed)
Pt's other question. How will going on Livalo affect developing diabetes since it runs in his family? If this is not a major concern then he will try & go ahead & send Rx into [facility] in [place]

## 2016-12-19 NOTE — Telephone Encounter (Signed)
livalo would be my first choice, after that I would recommend lipitor or simvastatin.   Insurance coverage is the primary limiting concern.   livalo 1 mg once daily if desired, repeat labs (CMP and Lipid) in 2 months   Laroy Apple, MD [facility] 12/19/2016, 4:05 PM

## 2016-12-21 ENCOUNTER — Other Ambulatory Visit: Payer: Self-pay | Admitting: Family Medicine

## 2016-12-23 ENCOUNTER — Telehealth: Payer: Self-pay | Admitting: Family Medicine

## 2016-12-23 NOTE — Telephone Encounter (Signed)
lmtcb jkp 2/23 - I contacted pharmacy, they do have prescription but because he has a deductible to meet, the cost is $273.00, they have the prescription but do not know if he wants to pay that much.

## 2016-12-23 NOTE — Telephone Encounter (Signed)
Livalo is 273 and would like something cheaper.

## 2016-12-23 NOTE — Telephone Encounter (Signed)
Patient was prescribed Livalo on 12/19/16, cost of medication would be $273.00 because he has to meet deductible before insurance will pay.  He cannot afford and would like to know if something less expensive could be prescribed instead.  Please advise.

## 2016-12-23 NOTE — Telephone Encounter (Signed)
Patient aware of new prescription

## 2016-12-23 NOTE — Telephone Encounter (Signed)
Will try simvastatin.   Laroy Apple, MD [facility] 12/23/2016, 3:04 PM

## 2016-12-26 MED ORDER — SIMVASTATIN 20 MG PO TABS: 20.0000 mg | ORAL_TABLET | Freq: Every day | ORAL | 5 refills | Status: DC

## 2016-12-26 MED ORDER — PANTOPRAZOLE SODIUM 40 MG PO TBEC: DELAYED_RELEASE_TABLET | ORAL | 1 refills | Status: DC

## 2016-12-26 NOTE — Telephone Encounter (Signed)
LMOVM which [facility] in [place]

## 2016-12-26 NOTE — Addendum Note (Signed)
Addended by: Jamelle Haring on: 12/26/2016 12:06 PM   Modules accepted: Orders

## 2017-02-06 DIAGNOSIS — R300 Dysuria: Secondary | ICD-10-CM | POA: Diagnosis not present

## 2017-02-16 ENCOUNTER — Telehealth: Payer: Self-pay | Admitting: Family Medicine

## 2017-02-16 NOTE — Telephone Encounter (Signed)
Patient called stating that when he woke up this morning his back was hurting so he stretched.  When patient stretched he had a burning in his chest on both sides.   Patient states that chest pain has currently subsided.

## 2017-02-16 NOTE — Telephone Encounter (Signed)
This is consistent with musculoskeltal pain at face value. I recommend full eval if returns.   Laroy Apple, MD [facility] 02/16/2017, 5:54 PM

## 2017-02-17 NOTE — Telephone Encounter (Signed)
Left message for patient to call and schedule an appointment if still having muscle strain pain.

## 2017-02-20 ENCOUNTER — Telehealth: Payer: Self-pay | Admitting: *Deleted

## 2017-02-20 ENCOUNTER — Encounter: Payer: Self-pay | Admitting: Family Medicine

## 2017-02-20 ENCOUNTER — Telehealth: Payer: Self-pay | Admitting: Pulmonary Disease

## 2017-02-20 ENCOUNTER — Ambulatory Visit (INDEPENDENT_AMBULATORY_CARE_PROVIDER_SITE_OTHER): Payer: Medicare Other | Admitting: Family Medicine

## 2017-02-20 VITALS — BP 133/88 | HR 77 | Temp 97.3°F | Ht 71.0 in | Wt 201.0 lb

## 2017-02-20 DIAGNOSIS — R05 Cough: Secondary | ICD-10-CM | POA: Diagnosis not present

## 2017-02-20 DIAGNOSIS — R0789 Other chest pain: Secondary | ICD-10-CM | POA: Diagnosis not present

## 2017-02-20 DIAGNOSIS — R053 Chronic cough: Secondary | ICD-10-CM

## 2017-02-20 DIAGNOSIS — E785 Hyperlipidemia, unspecified: Secondary | ICD-10-CM

## 2017-02-20 NOTE — Telephone Encounter (Signed)
Please review for treadmill  

## 2017-02-20 NOTE — Telephone Encounter (Signed)
LMTCB

## 2017-02-20 NOTE — Telephone Encounter (Signed)
Yes go ahead and schedule him for treadmill

## 2017-02-20 NOTE — Progress Notes (Signed)
   HPI  Patient presents today here for follow-up chronic medical conditions.  Hyperlipidemia Started taking simvastatin, no overt side effects.  Chronic cough Not improved with current medications, patient is planning to follow-up with pulmonology.  Chest pain New problem described as bilateral central chest pain radiating to the back, happens mostly in the middle the night after moving certain directions, he describes a gurgling in his chest whenever this happens.  It lasts 45 minutes to an hour and resolves on its own. No exertional symptoms No associated shortness of breath, sweats, or chills.  PMH: Smoking status noted ROS: Per HPI  Objective: BP 133/88   Pulse 77   Temp 97.3 F (36.3 C) (Oral)   Ht 5\' 11"  (1.803 m)   Wt 201 lb (91.2 kg)   BMI 28.03 kg/m  Gen: NAD, alert, cooperative with exam HEENT: NCAT CV: RRR, good S1/S2, no murmur Resp: CTABL, no wheezes, non-labored Ext: No edema, warm Neuro: Alert and oriented, No gross deficits  Assessment and plan:  # Atypical chest pain Unlikely to be cardiac related given his description of pain, however given age and risk factors EKG and stress test we pursued. Stress test in the clinic. EKG Today shows NSR  # Hyperlipidemia Repeat labs, started simvastatin last visit  # Cough Recommended follow-up with pulmonology, not improved. Given possibility of esophageal spasms and persistent cough and did recommend continuing his PPI.    Orders Placed This Encounter  Procedures  . EKG 12-Lead    Meds ordered this encounter  Medications  . pseudoephedrine (SUDAFED) 60 MG tablet    Sig: Take 60 mg by mouth every 4 (four) hours as needed for congestion.  . chlorpheniramine (CHLOR-TRIMETON) 4 MG tablet    Sig: Take 4 mg by mouth 2 (two) times daily as needed for allergies.    Laroy Apple, MD [facility] 02/20/2017, 8:56 AM

## 2017-02-20 NOTE — Patient Instructions (Addendum)
Great to see you!  We will call with or send a mychart message with your labs within 1 wewek.

## 2017-02-21 LAB — CMP14+EGFR
ALK PHOS: 50 IU/L (ref 39–117)
ALT: 24 IU/L (ref 0–44)
AST: 20 IU/L (ref 0–40)
Albumin/Globulin Ratio: 2 (ref 1.2–2.2)
Albumin: 4.3 g/dL (ref 3.6–4.8)
BUN/Creatinine Ratio: 13 (ref 10–24)
BUN: 17 mg/dL (ref 8–27)
Bilirubin Total: 0.4 mg/dL (ref 0.0–1.2)
CO2: 24 mmol/L (ref 18–29)
CREATININE: 1.27 mg/dL (ref 0.76–1.27)
Calcium: 9 mg/dL (ref 8.6–10.2)
Chloride: 100 mmol/L (ref 96–106)
GFR calc Af Amer: 68 mL/min/{1.73_m2} (ref >59)
GFR calc non Af Amer: 59 mL/min/{1.73_m2} — ABNORMAL LOW (ref >59)
GLOBULIN, TOTAL: 2.2 g/dL (ref 1.5–4.5)
GLUCOSE: 95 mg/dL (ref 65–99)
Potassium: 4.4 mmol/L (ref 3.5–5.2)
SODIUM: 139 mmol/L (ref 134–144)
Total Protein: 6.5 g/dL (ref 6.0–8.5)

## 2017-02-21 LAB — LIPID PANEL
Chol/HDL Ratio: 2.7 ratio (ref 0.0–5.0)
Cholesterol, Total: 134 mg/dL (ref 100–199)
HDL: 49 mg/dL
LDL Calculated: 70 mg/dL (ref 0–99)
Triglycerides: 75 mg/dL (ref 0–149)
VLDL Cholesterol Cal: 15 mg/dL (ref 5–40)

## 2017-02-21 NOTE — Telephone Encounter (Signed)
Called and spoke to pt. Pt states the cough is slightly improved but not enough to make much of a difference, per pt. Pt states he is taking the Chlorpheniramine, Sudafed, and Protonix as prescribed. Pt is requesting recs.   Dr. Elsworth Soho, please advise. Thanks.

## 2017-02-21 NOTE — Telephone Encounter (Signed)
Patient aware and appointment scheduled.  

## 2017-02-21 NOTE — Telephone Encounter (Signed)
Please schedule CT chest with contrast for chronic cough and follow-up with TP

## 2017-02-21 NOTE — Telephone Encounter (Signed)
Called and spoke with pt and he is aware of RA recs that the CT scan will be done and pt will need to schedule an appt with TP after the CT.  Pt is aware.

## 2017-03-02 ENCOUNTER — Ambulatory Visit (INDEPENDENT_AMBULATORY_CARE_PROVIDER_SITE_OTHER): Payer: Medicare Other

## 2017-03-02 DIAGNOSIS — R0789 Other chest pain: Secondary | ICD-10-CM

## 2017-03-07 ENCOUNTER — Ambulatory Visit (HOSPITAL_COMMUNITY): Payer: Medicare Other

## 2017-03-10 ENCOUNTER — Ambulatory Visit: Payer: Medicare Other | Admitting: Adult Health

## 2017-03-10 LAB — EXERCISE TOLERANCE TEST
CHL CUP MPHR: 155 {beats}/min
CSEPED: 6 min
CSEPEDS: 34 s
CSEPHR: 89 %
CSEPPHR: 138 {beats}/min
Estimated workload: 7.6 METS
RPE: 6
Rest HR: 93 {beats}/min

## 2017-03-21 DIAGNOSIS — S83281D Other tear of lateral meniscus, current injury, right knee, subsequent encounter: Secondary | ICD-10-CM | POA: Diagnosis not present

## 2017-03-21 DIAGNOSIS — M1711 Unilateral primary osteoarthritis, right knee: Secondary | ICD-10-CM | POA: Diagnosis not present

## 2017-03-22 ENCOUNTER — Ambulatory Visit (HOSPITAL_COMMUNITY)
Admission: RE | Admit: 2017-03-22 | Discharge: 2017-03-22 | Disposition: A | Discharge status: Home / Self Care | Payer: Medicare Other | Source: Ambulatory Visit | Attending: Pulmonary Disease | Admitting: Pulmonary Disease

## 2017-03-22 DIAGNOSIS — M5134 Other intervertebral disc degeneration, thoracic region: Secondary | ICD-10-CM | POA: Insufficient documentation

## 2017-03-22 DIAGNOSIS — R05 Cough: Secondary | ICD-10-CM | POA: Diagnosis not present

## 2017-03-22 DIAGNOSIS — R918 Other nonspecific abnormal finding of lung field: Secondary | ICD-10-CM | POA: Diagnosis not present

## 2017-03-22 DIAGNOSIS — R053 Chronic cough: Secondary | ICD-10-CM

## 2017-03-22 DIAGNOSIS — R079 Chest pain, unspecified: Secondary | ICD-10-CM | POA: Diagnosis not present

## 2017-03-22 MED ORDER — IOPAMIDOL (ISOVUE-300) INJECTION 61%
75.0000 mL | Freq: Once | INTRAVENOUS | Status: AC | PRN
  Administered 2017-03-22: 75 mL via INTRAVENOUS

## 2017-03-24 ENCOUNTER — Ambulatory Visit: Payer: Medicare Other | Admitting: Adult Health

## 2017-03-28 DIAGNOSIS — M1711 Unilateral primary osteoarthritis, right knee: Secondary | ICD-10-CM | POA: Diagnosis not present

## 2017-03-28 DIAGNOSIS — M25461 Effusion, right knee: Secondary | ICD-10-CM | POA: Diagnosis not present

## 2017-03-28 DIAGNOSIS — Z9889 Other specified postprocedural states: Secondary | ICD-10-CM | POA: Diagnosis not present

## 2017-03-28 DIAGNOSIS — M25561 Pain in right knee: Secondary | ICD-10-CM | POA: Diagnosis not present

## 2017-03-30 ENCOUNTER — Telehealth: Payer: Self-pay | Admitting: Pulmonary Disease

## 2017-03-30 ENCOUNTER — Ambulatory Visit: Payer: Medicare Other | Admitting: Adult Health

## 2017-03-30 NOTE — Telephone Encounter (Signed)
Called pt, states he could not speak at this time and asked to call back.  Wcb.

## 2017-03-30 NOTE — Telephone Encounter (Signed)
Spoke with pt, states that since his CT results required no follow up at this time, wants to know when he should follow up.  Last seen 11/28/16, no follow up currently scheduled.   RA please advise.  Thanks!

## 2017-03-30 NOTE — Telephone Encounter (Signed)
He can follow-up as needed for cough. If cough is resolved he does not need follow-up

## 2017-03-31 NOTE — Telephone Encounter (Signed)
Pt aware of rec's per Dr Elsworth Soho. Nothing further needed.

## 2017-05-08 DIAGNOSIS — N400 Enlarged prostate without lower urinary tract symptoms: Secondary | ICD-10-CM | POA: Diagnosis not present

## 2017-05-08 DIAGNOSIS — N486 Induration penis plastica: Secondary | ICD-10-CM | POA: Diagnosis not present

## 2017-06-09 ENCOUNTER — Other Ambulatory Visit: Payer: Self-pay | Admitting: Family Medicine

## 2017-06-14 ENCOUNTER — Other Ambulatory Visit: Payer: Self-pay | Admitting: *Deleted

## 2017-06-14 MED ORDER — ALLOPURINOL 300 MG PO TABS: 300.0000 mg | ORAL_TABLET | Freq: Every day | ORAL | 1 refills | Status: DC

## 2017-06-14 MED ORDER — SIMVASTATIN 20 MG PO TABS
20.0000 mg | ORAL_TABLET | Freq: Every day | ORAL | 1 refills | Status: DC
Start: 2017-06-14 — End: 2017-09-18

## 2017-07-31 ENCOUNTER — Other Ambulatory Visit: Payer: Self-pay | Admitting: Family Medicine

## 2017-08-28 DIAGNOSIS — Z23 Encounter for immunization: Secondary | ICD-10-CM | POA: Diagnosis not present

## 2017-09-18 ENCOUNTER — Encounter: Payer: Self-pay | Admitting: Family Medicine

## 2017-09-18 ENCOUNTER — Ambulatory Visit (INDEPENDENT_AMBULATORY_CARE_PROVIDER_SITE_OTHER): Payer: Medicare Other | Admitting: Family Medicine

## 2017-09-18 VITALS — BP 134/83 | HR 72 | Temp 97.0°F | Ht 71.0 in | Wt 195.2 lb

## 2017-09-18 DIAGNOSIS — E785 Hyperlipidemia, unspecified: Secondary | ICD-10-CM | POA: Diagnosis not present

## 2017-09-18 DIAGNOSIS — M79642 Pain in left hand: Secondary | ICD-10-CM

## 2017-09-18 DIAGNOSIS — M170 Bilateral primary osteoarthritis of knee: Secondary | ICD-10-CM | POA: Diagnosis not present

## 2017-09-18 DIAGNOSIS — M79641 Pain in right hand: Secondary | ICD-10-CM

## 2017-09-18 LAB — LIPID PANEL
CHOL/HDL RATIO: 2.8 ratio (ref 0.0–5.0)
Cholesterol, Total: 129 mg/dL (ref 100–199)
HDL: 46 mg/dL (ref >39)
LDL CALC: 69 mg/dL (ref 0–99)
TRIGLYCERIDES: 68 mg/dL (ref 0–149)
VLDL CHOLESTEROL CAL: 14 mg/dL (ref 5–40)

## 2017-09-18 LAB — CMP14+EGFR
ALBUMIN: 4.5 g/dL (ref 3.6–4.8)
ALK PHOS: 49 IU/L (ref 39–117)
ALT: 23 IU/L (ref 0–44)
AST: 19 IU/L (ref 0–40)
Albumin/Globulin Ratio: 2.1 (ref 1.2–2.2)
BUN / CREAT RATIO: 15 (ref 10–24)
BUN: 19 mg/dL (ref 8–27)
Bilirubin Total: 0.3 mg/dL (ref 0.0–1.2)
CALCIUM: 9.4 mg/dL (ref 8.6–10.2)
CO2: 23 mmol/L (ref 20–29)
CREATININE: 1.3 mg/dL — AB (ref 0.76–1.27)
Chloride: 103 mmol/L (ref 96–106)
GFR calc Af Amer: 66 mL/min/{1.73_m2} (ref >59)
GFR, EST NON AFRICAN AMERICAN: 57 mL/min/{1.73_m2} — AB (ref >59)
GLOBULIN, TOTAL: 2.1 g/dL (ref 1.5–4.5)
GLUCOSE: 95 mg/dL (ref 65–99)
Potassium: 4.4 mmol/L (ref 3.5–5.2)
SODIUM: 141 mmol/L (ref 134–144)
Total Protein: 6.6 g/dL (ref 6.0–8.5)

## 2017-09-18 LAB — CBC WITH DIFFERENTIAL/PLATELET
BASOS: 1 %
Basophils Absolute: 0.1 10*3/uL (ref 0.0–0.2)
EOS (ABSOLUTE): 0.4 10*3/uL (ref 0.0–0.4)
EOS: 6 %
HEMATOCRIT: 41.7 % (ref 37.5–51.0)
HEMOGLOBIN: 14.8 g/dL (ref 13.0–17.7)
IMMATURE GRANULOCYTES: 0 %
Immature Grans (Abs): 0 10*3/uL (ref 0.0–0.1)
Lymphocytes Absolute: 1.3 10*3/uL (ref 0.7–3.1)
Lymphs: 22 %
MCH: 31.3 pg (ref 26.6–33.0)
MCHC: 35.5 g/dL (ref 31.5–35.7)
MCV: 88 fL (ref 79–97)
MONOCYTES: 9 %
MONOS ABS: 0.5 10*3/uL (ref 0.1–0.9)
NEUTROS PCT: 62 %
Neutrophils Absolute: 3.7 10*3/uL (ref 1.4–7.0)
Platelets: 228 10*3/uL (ref 150–379)
RBC: 4.73 x10E6/uL (ref 4.14–5.80)
RDW: 13.9 % (ref 12.3–15.4)
WBC: 5.9 10*3/uL (ref 3.4–10.8)

## 2017-09-18 LAB — BAYER DCA HB A1C WAIVED: HB A1C: 5.4 % (ref <7.0)

## 2017-09-18 MED ORDER — ALLOPURINOL 300 MG PO TABS: 300.0000 mg | ORAL_TABLET | Freq: Every day | ORAL | 3 refills | Status: DC

## 2017-09-18 MED ORDER — PANTOPRAZOLE SODIUM 40 MG PO TBEC: DELAYED_RELEASE_TABLET | ORAL | 3 refills | Status: DC

## 2017-09-18 MED ORDER — SIMVASTATIN 20 MG PO TABS: 20.0000 mg | ORAL_TABLET | Freq: Every day | ORAL | 3 refills | Status: DC

## 2017-09-18 NOTE — Patient Instructions (Signed)
Great to see you!   

## 2017-09-18 NOTE — Progress Notes (Signed)
   HPI  Patient presents today here for follow-up chronic medical conditions as well as discussing the surgery.  Patient plans to have right knee surgery as it is changing his gait and causing some gait instability.  He does not have severe knee pain.  He responds well to diclofenac.  He has had a long time bilateral hand pain in multiple joints, worse with activity.  Improved with diclofenac.  Hyperlipidemia Good medication compliance, patient is also concerned about his blood sugar.  States multiple family members has diabetes.  PMH: Smoking status noted ROS: Per HPI  Objective: BP 134/83   Pulse 72   Temp (!) 97 F (36.1 C) (Oral)   Ht '5\' 11"'$  (1.803 m)   Wt 195 lb 3.2 oz (88.5 kg)   BMI 27.22 kg/m  Gen: NAD, alert, cooperative with exam HEENT: NCAT CV: RRR, good S1/S2, no murmur Ext: No edema, warm Neuro: Alert and oriented, No gross deficits  Assessment and plan:  #Hyperlipidemia LDL previously well controlled, no changes Labs today  Preoperative evaluation-patient with previous chest pain, still having some intermittent chest pain felt to be muscular, treadmill stress test was normal.  #Arthritis in both knees Right greater than left arthritis, patient will schedule surgery Discussed risks and benefits of diclofenac  #Bilateral hand pain Likely OA Discussed again risk and benefits of long-term NSAID use, patient will try Tylenol after surgery     Orders Placed This Encounter  Procedures  . CMP14+EGFR  . CBC with Differential/Platelet  . Lipid panel  . Bayer DCA Hb A1c Waived    Meds ordered this encounter  Medications  . diclofenac (VOLTAREN) 75 MG EC tablet    Sig: Take 1 tablet 2 (two) times daily by mouth.    Refill:  0    Laroy Apple, MD [facility] 09/18/2017, 8:37 AM

## 2017-11-17 ENCOUNTER — Inpatient Hospital Stay (HOSPITAL_COMMUNITY): Admission: RE | Admit: 2017-11-17 | Payer: Medicare Other | Source: Ambulatory Visit

## 2017-11-21 DIAGNOSIS — M1711 Unilateral primary osteoarthritis, right knee: Secondary | ICD-10-CM | POA: Diagnosis not present

## 2017-11-21 DIAGNOSIS — S83281D Other tear of lateral meniscus, current injury, right knee, subsequent encounter: Secondary | ICD-10-CM | POA: Diagnosis not present

## 2017-12-01 ENCOUNTER — Other Ambulatory Visit: Payer: Self-pay | Admitting: Family Medicine

## 2018-01-01 ENCOUNTER — Inpatient Hospital Stay: Admit: 2018-01-01 | Payer: Medicare Other | Admitting: Orthopedic Surgery

## 2018-01-01 SURGERY — ARTHROPLASTY, KNEE, TOTAL
Anesthesia: Spinal | Laterality: Right

## 2018-01-24 ENCOUNTER — Encounter (HOSPITAL_COMMUNITY): Payer: Self-pay | Admitting: Physician Assistant

## 2018-01-24 DIAGNOSIS — M1711 Unilateral primary osteoarthritis, right knee: Secondary | ICD-10-CM | POA: Diagnosis present

## 2018-01-24 DIAGNOSIS — H9312 Tinnitus, left ear: Secondary | ICD-10-CM | POA: Diagnosis present

## 2018-01-24 DIAGNOSIS — E785 Hyperlipidemia, unspecified: Secondary | ICD-10-CM | POA: Diagnosis present

## 2018-01-24 NOTE — H&P (View-Only) (Signed)
TOTAL KNEE ADMISSION H&P  Patient is being admitted for right total knee arthroplasty.  Subjective:  Chief Complaint:right knee pain.  HPI: [name], 67 y.o. male, has a history of pain and functional disability in the right knee due to arthritis and has failed non-surgical conservative treatments for greater than 12 weeks to includeNSAID's and/or analgesics, corticosteriod injections, viscosupplementation injections, flexibility and strengthening excercises, supervised PT with diminished ADL's post treatment, weight reduction as appropriate and activity modification.  Onset of symptoms was gradual, starting 10 years ago with gradually worsening course since that time. The patient noted prior procedures on the knee to include  arthroscopy and menisectomy on the right knee(s).  Patient currently rates pain in the right knee(s) at 10 out of 10 with activity. Patient has night pain, worsening of pain with activity and weight bearing, pain that interferes with activities of daily living, crepitus and joint swelling.  Patient has evidence of subchondral sclerosis, periarticular osteophytes and joint space narrowing by imaging studies.  There is no active infection.  Patient Active Problem List   Diagnosis Date Noted  . Primary localized osteoarthrosis of the knee, right   . Tinnitus of left ear   . Dyslipidemia   . Chronic cough 11/21/2016  . Arthritis of both knees 05/24/2013  . Gout 05/24/2013  . HLD (hyperlipidemia) 05/24/2013   Past Medical History:  Diagnosis Date  . Dyslipidemia   . Gout   . Tinnitus of left ear     Past Surgical History:  Procedure Laterality Date  . KNEE ARTHROSCOPY Right 10/31/06  . MOUTH SURGERY    . TOE SURGERY Bilateral    Great toe each foot from athletic injuries    No current facility-administered medications for this encounter.    Current Outpatient Medications  Medication Sig Dispense Refill Last Dose  . allopurinol (ZYLOPRIM) 300 MG  tablet Take 1 tablet (300 mg total) daily by mouth. (Patient taking differently: Take 300 mg by mouth every evening. ) 90 tablet 3 01/23/2018 at Unknown time  . desonide (DESOWEN) 0.05 % cream Apply 1 application topically 2 (two) times daily as needed.   01/24/2018 at Unknown time  . diclofenac (VOLTAREN) 75 MG EC tablet Take 75 mg by mouth 2 (two) times daily with a meal.   0 Past Week at Unknown time  . ibuprofen (ADVIL,MOTRIN) 200 MG tablet Take 400 mg by mouth every 8 (eight) hours as needed (alternates between naproxen & ibuprofen for pain.).   Past Week at Unknown time  . pantoprazole (PROTONIX) 40 MG tablet TAKE 1 TABLET BY MOUTH  DAILY AFTER DINNER 90 tablet 3 01/23/2018 at Unknown time  . simvastatin (ZOCOR) 20 MG tablet Take 1 tablet (20 mg total) at bedtime by mouth. 90 tablet 3 01/23/2018 at Unknown time  . Turmeric Curcumin 500 MG CAPS Take 500 mg by mouth daily.   Past Week at Unknown time  . naproxen sodium (ALEVE) 220 MG tablet Take 440 mg by mouth 2 (two) times daily as needed (alternates between naproxen & ibuprofen for pain.).   More than a month at Unknown time   Allergies  Allergen Reactions  . Indomethacin     Disoriented    Social History   Tobacco Use  . Smoking status: Never Smoker  . Smokeless tobacco: Never Used  Substance Use Topics  . Alcohol use: Yes    Alcohol/week: 1.2 oz    Types: 2 Cans of beer per week    Family History  Problem  Relation Age of Onset  . Diabetes Mother   . Diabetes Maternal Grandmother      Review of Systems  Constitutional: Negative.   HENT: Negative.   Eyes: Negative.   Respiratory: Negative.   Cardiovascular: Negative.   Gastrointestinal: Negative.   Genitourinary: Negative.   Musculoskeletal: Positive for back pain and joint pain.  Skin: Negative.   Neurological: Negative.   Endo/Heme/Allergies: Negative.     Objective:  Physical Exam  Constitutional: He is oriented to person, place, and time. He appears  well-developed and well-nourished.  HENT:  Head: Normocephalic and atraumatic.  Eyes: Pupils are equal, round, and reactive to light. Conjunctivae and EOM are normal.  Neck: Neck supple.  Cardiovascular: Normal rate and regular rhythm.  Respiratory: Effort normal and breath sounds normal.  GI: Soft. Bowel sounds are normal.  Genitourinary:  Genitourinary Comments: Not pertinent to current symptomatology therefore not examined.  Musculoskeletal:  Examination of his right knee reveals pain medially and laterally, mild varus deformity, range of motion 0-110 degrees.  Knee is stable with normal patellar tracking.  Examination of his left knee reveals full range of motion without pain, swelling, weakness, or instability.  Vascular exam:  Pulses 2+ and symmetric.  Neurologic exam:  Distal motor and sensory examination is within normal limits.    Neurological: He is alert and oriented to person, place, and time.  Skin: Skin is warm and dry.  Psychiatric: He has a normal mood and affect. His behavior is normal.    Vital signs in last 24 hours: Temp:  [98.1 F (36.7 C)] 98.1 F (36.7 C) (03/27 1400) Pulse Rate:  [76] 76 (03/27 1400) BP: (149)/(94) 149/94 (03/27 1400) SpO2:  [96 %] 96 % (03/27 1400) Weight:  [89.6 kg (197 lb 9.6 oz)] 89.6 kg (197 lb 9.6 oz) (03/27 1400)  Labs:   Estimated body mass index is 27.56 kg/m as calculated from the following:   Height as of this encounter: 5\' 11"  (1.803 m).   Weight as of this encounter: 89.6 kg (197 lb 9.6 oz).   Imaging Review Plain radiographs demonstrate severe degenerative joint disease of the right knee(s). The overall alignment issignificant varus. The bone quality appears to be good for age and reported activity level.   Preoperative templating of the joint replacement has been completed, documented, and submitted to the Operating Room personnel in order to optimize intra-operative equipment management.    Patient's anticipated LOS  is less than 2 midnights, meeting these requirements: - Younger than 5 - Lives within 1 hour of care - Has a competent adult at home to recover with post-op recover - NO history of  - Chronic pain requiring opiods  - Diabetes  - Coronary Artery Disease  - Heart failure  - Heart attack  - Stroke  - DVT/VTE  - Cardiac arrhythmia  - Respiratory Failure/COPD  - Renal failure  - Anemia  - Advanced Liver disease        Assessment/Plan:  End stage arthritis, right knee  Active Problems:   Primary localized osteoarthrosis of the knee, right   Tinnitus of left ear   Dyslipidemia   The patient history, physical examination, clinical judgment of the provider and imaging studies are consistent with end stage degenerative joint disease of the right knee(s) and total knee arthroplasty is deemed medically necessary. The treatment options including medical management, injection therapy arthroscopy and arthroplasty were discussed at length. The risks and benefits of total knee arthroplasty were presented and reviewed. The  risks due to aseptic loosening, infection, stiffness, patella tracking problems, thromboembolic complications and other imponderables were discussed. The patient acknowledged the explanation, agreed to proceed with the plan and consent was signed. Patient is being admitted for inpatient treatment for surgery, pain control, PT, OT, prophylactic antibiotics, VTE prophylaxis, progressive ambulation and ADL's and discharge planning. The patient is planning to be discharged home with home health services

## 2018-01-24 NOTE — H&P (Signed)
TOTAL KNEE ADMISSION H&P  Patient is being admitted for right total knee arthroplasty.  Subjective:  Chief Complaint:right knee pain.  HPI: [name], 67 y.o. male, has a history of pain and functional disability in the right knee due to arthritis and has failed non-surgical conservative treatments for greater than 12 weeks to includeNSAID's and/or analgesics, corticosteriod injections, viscosupplementation injections, flexibility and strengthening excercises, supervised PT with diminished ADL's post treatment, weight reduction as appropriate and activity modification.  Onset of symptoms was gradual, starting 10 years ago with gradually worsening course since that time. The patient noted prior procedures on the knee to include  arthroscopy and menisectomy on the right knee(s).  Patient currently rates pain in the right knee(s) at 10 out of 10 with activity. Patient has night pain, worsening of pain with activity and weight bearing, pain that interferes with activities of daily living, crepitus and joint swelling.  Patient has evidence of subchondral sclerosis, periarticular osteophytes and joint space narrowing by imaging studies.  There is no active infection.  Patient Active Problem List   Diagnosis Date Noted  . Primary localized osteoarthrosis of the knee, right   . Tinnitus of left ear   . Dyslipidemia   . Chronic cough 11/21/2016  . Arthritis of both knees 05/24/2013  . Gout 05/24/2013  . HLD (hyperlipidemia) 05/24/2013   Past Medical History:  Diagnosis Date  . Dyslipidemia   . Gout   . Tinnitus of left ear     Past Surgical History:  Procedure Laterality Date  . KNEE ARTHROSCOPY Right 10/31/06  . MOUTH SURGERY    . TOE SURGERY Bilateral    Great toe each foot from athletic injuries    No current facility-administered medications for this encounter.    Current Outpatient Medications  Medication Sig Dispense Refill Last Dose  . allopurinol (ZYLOPRIM) 300 MG  tablet Take 1 tablet (300 mg total) daily by mouth. (Patient taking differently: Take 300 mg by mouth every evening. ) 90 tablet 3 01/23/2018 at Unknown time  . desonide (DESOWEN) 0.05 % cream Apply 1 application topically 2 (two) times daily as needed.   01/24/2018 at Unknown time  . diclofenac (VOLTAREN) 75 MG EC tablet Take 75 mg by mouth 2 (two) times daily with a meal.   0 Past Week at Unknown time  . ibuprofen (ADVIL,MOTRIN) 200 MG tablet Take 400 mg by mouth every 8 (eight) hours as needed (alternates between naproxen & ibuprofen for pain.).   Past Week at Unknown time  . pantoprazole (PROTONIX) 40 MG tablet TAKE 1 TABLET BY MOUTH  DAILY AFTER DINNER 90 tablet 3 01/23/2018 at Unknown time  . simvastatin (ZOCOR) 20 MG tablet Take 1 tablet (20 mg total) at bedtime by mouth. 90 tablet 3 01/23/2018 at Unknown time  . Turmeric Curcumin 500 MG CAPS Take 500 mg by mouth daily.   Past Week at Unknown time  . naproxen sodium (ALEVE) 220 MG tablet Take 440 mg by mouth 2 (two) times daily as needed (alternates between naproxen & ibuprofen for pain.).   More than a month at Unknown time   Allergies  Allergen Reactions  . Indomethacin     Disoriented    Social History   Tobacco Use  . Smoking status: Never Smoker  . Smokeless tobacco: Never Used  Substance Use Topics  . Alcohol use: Yes    Alcohol/week: 1.2 oz    Types: 2 Cans of beer per week    Family History  Problem  Relation Age of Onset  . Diabetes Mother   . Diabetes Maternal Grandmother      Review of Systems  Constitutional: Negative.   HENT: Negative.   Eyes: Negative.   Respiratory: Negative.   Cardiovascular: Negative.   Gastrointestinal: Negative.   Genitourinary: Negative.   Musculoskeletal: Positive for back pain and joint pain.  Skin: Negative.   Neurological: Negative.   Endo/Heme/Allergies: Negative.     Objective:  Physical Exam  Constitutional: He is oriented to person, place, and time. He appears  well-developed and well-nourished.  HENT:  Head: Normocephalic and atraumatic.  Eyes: Pupils are equal, round, and reactive to light. Conjunctivae and EOM are normal.  Neck: Neck supple.  Cardiovascular: Normal rate and regular rhythm.  Respiratory: Effort normal and breath sounds normal.  GI: Soft. Bowel sounds are normal.  Genitourinary:  Genitourinary Comments: Not pertinent to current symptomatology therefore not examined.  Musculoskeletal:  Examination of his right knee reveals pain medially and laterally, mild varus deformity, range of motion 0-110 degrees.  Knee is stable with normal patellar tracking.  Examination of his left knee reveals full range of motion without pain, swelling, weakness, or instability.  Vascular exam:  Pulses 2+ and symmetric.  Neurologic exam:  Distal motor and sensory examination is within normal limits.    Neurological: He is alert and oriented to person, place, and time.  Skin: Skin is warm and dry.  Psychiatric: He has a normal mood and affect. His behavior is normal.    Vital signs in last 24 hours: Temp:  [98.1 F (36.7 C)] 98.1 F (36.7 C) (03/27 1400) Pulse Rate:  [76] 76 (03/27 1400) BP: (149)/(94) 149/94 (03/27 1400) SpO2:  [96 %] 96 % (03/27 1400) Weight:  [89.6 kg (197 lb 9.6 oz)] 89.6 kg (197 lb 9.6 oz) (03/27 1400)  Labs:   Estimated body mass index is 27.56 kg/m as calculated from the following:   Height as of this encounter: 5\' 11"  (1.803 m).   Weight as of this encounter: 89.6 kg (197 lb 9.6 oz).   Imaging Review Plain radiographs demonstrate severe degenerative joint disease of the right knee(s). The overall alignment issignificant varus. The bone quality appears to be good for age and reported activity level.   Preoperative templating of the joint replacement has been completed, documented, and submitted to the Operating Room personnel in order to optimize intra-operative equipment management.    Patient's anticipated LOS  is less than 2 midnights, meeting these requirements: - Younger than 38 - Lives within 1 hour of care - Has a competent adult at home to recover with post-op recover - NO history of  - Chronic pain requiring opiods  - Diabetes  - Coronary Artery Disease  - Heart failure  - Heart attack  - Stroke  - DVT/VTE  - Cardiac arrhythmia  - Respiratory Failure/COPD  - Renal failure  - Anemia  - Advanced Liver disease        Assessment/Plan:  End stage arthritis, right knee  Active Problems:   Primary localized osteoarthrosis of the knee, right   Tinnitus of left ear   Dyslipidemia   The patient history, physical examination, clinical judgment of the provider and imaging studies are consistent with end stage degenerative joint disease of the right knee(s) and total knee arthroplasty is deemed medically necessary. The treatment options including medical management, injection therapy arthroscopy and arthroplasty were discussed at length. The risks and benefits of total knee arthroplasty were presented and reviewed. The  risks due to aseptic loosening, infection, stiffness, patella tracking problems, thromboembolic complications and other imponderables were discussed. The patient acknowledged the explanation, agreed to proceed with the plan and consent was signed. Patient is being admitted for inpatient treatment for surgery, pain control, PT, OT, prophylactic antibiotics, VTE prophylaxis, progressive ambulation and ADL's and discharge planning. The patient is planning to be discharged home with home health services

## 2018-01-24 NOTE — Pre-Procedure Instructions (Signed)
Levone Otten Siemen  01/24/2018      [id number] - [place] [place] [address] Phone: [phone] Fax: [phone]  [facility] [phone] - [place], [address] [place] [address] Phone: [phone] Fax: [phone]    Your procedure is scheduled on Monday April 8.  Report to [facility] Admitting at 5:30 A.M.  Call this number if you have problems the morning of surgery:  [phone]   Remember:  Do not eat food or drink liquids after midnight.  Take these medicines the morning of surgery with A SIP OF WATER: NONE  7 days prior to surgery STOP taking any Aspirin(unless otherwise instructed by your surgeon), diclofenac (voltaren), Aleve, Naproxen, Ibuprofen, Motrin, Advil, Goody's, BC's, all herbal medications, fish oil, and all vitamins    Do not wear jewelry, make-up or nail polish.  Do not wear lotions, powders, or perfumes, or deodorant.  Do not shave 48 hours prior to surgery.  Men may shave face and neck.  Do not bring valuables to the hospital.  [facility] is not responsible for any belongings or valuables.  Contacts, dentures or bridgework may not be worn into surgery.  Leave your suitcase in the car.  After surgery it may be brought to your room.  For patients admitted to the hospital, discharge time will be determined by your treatment team.  Patients discharged the day of surgery will not be allowed to drive home.  Special instructions:    [place]- Preparing For Surgery  Before surgery, you can play an important role. Because skin is not sterile, your skin needs to be as free of germs as possible. You can reduce the number of germs on your skin by washing with CHG (chlorahexidine gluconate) Soap before surgery.  CHG is an antiseptic cleaner which kills  germs and bonds with the skin to continue killing germs even after washing.  Please do not use if you have an allergy to CHG or antibacterial soaps. If your skin becomes reddened/irritated stop using the CHG.  Do not shave (including legs and underarms) for at least 48 hours prior to first CHG shower. It is OK to shave your face.  Please follow these instructions carefully.   1. Shower the NIGHT BEFORE SURGERY and the MORNING OF SURGERY with CHG.   2. If you chose to wash your hair, wash your hair first as usual with your normal shampoo.  3. After you shampoo, rinse your hair and body thoroughly to remove the shampoo.  4. Use CHG as you would any other liquid soap. You can apply CHG directly to the skin and wash gently with a scrungie or a clean washcloth.   5. Apply the CHG Soap to your body ONLY FROM THE NECK DOWN.  Do not use on open wounds or open sores. Avoid contact with your eyes, ears, mouth and genitals (private parts). Wash Face and genitals (private parts)  with your normal soap.  6. Wash thoroughly, paying special attention to the area where your surgery will be performed.  7. Thoroughly rinse your body with warm water from the neck down.  8. DO NOT shower/wash with your normal soap after using and rinsing off the CHG Soap.  9. Pat yourself dry with  a CLEAN TOWEL.  10. Wear CLEAN PAJAMAS to bed the night before surgery, wear comfortable clothes the morning of surgery  11. Place CLEAN SHEETS on your bed the night of your first shower and DO NOT SLEEP WITH PETS.    Day of Surgery: Do not apply any deodorants/lotions. Please wear clean clothes to the hospital/surgery center.      Please read over the following fact sheets that you were given. Coughing and Deep Breathing, Total Joint Packet, MRSA Information and Surgical Site Infection Prevention

## 2018-01-25 ENCOUNTER — Inpatient Hospital Stay (HOSPITAL_COMMUNITY)
Admission: RE | Admit: 2018-01-25 | Discharge: 2018-01-25 | Disposition: A | Discharge status: Home / Self Care | Payer: Medicare Other | Source: Ambulatory Visit

## 2018-01-25 NOTE — Progress Notes (Signed)
Pt unable to make PAT appointment today. Pt concerned that he needs to have this appointment a week prior to surgery. Dr. Archie Endo office notified of patient's missed appointment. Pt to be scheduled for PAT appointment next week, still prior to surgery.

## 2018-01-26 ENCOUNTER — Other Ambulatory Visit (HOSPITAL_COMMUNITY): Payer: Medicare Other

## 2018-01-31 ENCOUNTER — Encounter (HOSPITAL_COMMUNITY)
Admission: RE | Admit: 2018-01-31 | Discharge: 2018-01-31 | Disposition: A | Discharge status: Home / Self Care | Payer: Medicare Other | Source: Ambulatory Visit | Attending: Orthopedic Surgery | Admitting: Orthopedic Surgery

## 2018-01-31 ENCOUNTER — Encounter (HOSPITAL_COMMUNITY): Payer: Self-pay

## 2018-01-31 DIAGNOSIS — Z01810 Encounter for preprocedural cardiovascular examination: Secondary | ICD-10-CM | POA: Insufficient documentation

## 2018-01-31 DIAGNOSIS — E785 Hyperlipidemia, unspecified: Secondary | ICD-10-CM | POA: Insufficient documentation

## 2018-01-31 DIAGNOSIS — I493 Ventricular premature depolarization: Secondary | ICD-10-CM | POA: Diagnosis not present

## 2018-01-31 DIAGNOSIS — M1711 Unilateral primary osteoarthritis, right knee: Secondary | ICD-10-CM | POA: Insufficient documentation

## 2018-01-31 DIAGNOSIS — Z79899 Other long term (current) drug therapy: Secondary | ICD-10-CM | POA: Insufficient documentation

## 2018-01-31 LAB — COMPREHENSIVE METABOLIC PANEL
ALBUMIN: 4.1 g/dL (ref 3.5–5.0)
ALK PHOS: 51 U/L (ref 38–126)
ALT: 21 U/L (ref 17–63)
AST: 20 U/L (ref 15–41)
Anion gap: 8 (ref 5–15)
BUN: 14 mg/dL (ref 6–20)
CALCIUM: 9.4 mg/dL (ref 8.9–10.3)
CO2: 27 mmol/L (ref 22–32)
CREATININE: 1.29 mg/dL — AB (ref 0.61–1.24)
Chloride: 103 mmol/L (ref 101–111)
GFR calc Af Amer: >60 mL/min (ref >60)
GFR calc non Af Amer: 56 mL/min — ABNORMAL LOW (ref >60)
GLUCOSE: 103 mg/dL — AB (ref 65–99)
Potassium: 4.2 mmol/L (ref 3.5–5.1)
SODIUM: 138 mmol/L (ref 135–145)
Total Bilirubin: 0.8 mg/dL (ref 0.3–1.2)
Total Protein: 7.5 g/dL (ref 6.5–8.1)

## 2018-01-31 LAB — CBC WITH DIFFERENTIAL/PLATELET
BASOS PCT: 0 %
Basophils Absolute: 0 10*3/uL (ref 0.0–0.1)
EOS ABS: 0.2 10*3/uL (ref 0.0–0.7)
Eosinophils Relative: 4 %
HCT: 46.5 % (ref 39.0–52.0)
HEMOGLOBIN: 16 g/dL (ref 13.0–17.0)
LYMPHS ABS: 1.1 10*3/uL (ref 0.7–4.0)
Lymphocytes Relative: 21 %
MCH: 31.9 pg (ref 26.0–34.0)
MCHC: 34.4 g/dL (ref 30.0–36.0)
MCV: 92.6 fL (ref 78.0–100.0)
Monocytes Absolute: 0.4 10*3/uL (ref 0.1–1.0)
Monocytes Relative: 8 %
NEUTROS PCT: 67 %
Neutro Abs: 3.7 10*3/uL (ref 1.7–7.7)
Platelets: 229 10*3/uL (ref 150–400)
RBC: 5.02 MIL/uL (ref 4.22–5.81)
RDW: 13.7 % (ref 11.5–15.5)
WBC: 5.5 10*3/uL (ref 4.0–10.5)

## 2018-01-31 LAB — APTT: aPTT: 35 seconds (ref 24–36)

## 2018-01-31 LAB — SURGICAL PCR SCREEN
MRSA, PCR: NEGATIVE
Staphylococcus aureus: NEGATIVE

## 2018-01-31 LAB — PROTIME-INR
INR: 1.04
Prothrombin Time: 13.6 seconds (ref 11.4–15.2)

## 2018-01-31 NOTE — Pre-Procedure Instructions (Signed)
[name]  01/31/2018      [id number] - [place], [address] [place] [address] Phone: [phone] Fax: [phone]  [facility] [phone] - [place], [address] [place] [address] Phone: [phone] Fax: [phone]    Your procedure is scheduled on Monday April 8.  Report to [facility] Admitting at 07:00 A.M.  Call this number if you have problems the morning of surgery:  [phone]   Remember:  Do not eat food or drink liquids after midnight.   Take these medicines the morning of surgery with A SIP OF WATER: NONE  7 days prior to surgery STOP taking any Aspirin(unless otherwise instructed by your surgeon), diclofenac (voltaren), Aleve, Naproxen, Ibuprofen, Motrin, Advil, Goody's, BC's, all herbal medications, fish oil, and all vitamins    Do not wear jewelry, make-up or nail polish.  Do not wear lotions, powders, or perfumes, or deodorant.  Do not shave 48 hours prior to surgery.  Men may shave face and neck.  Do not bring valuables to the hospital.  [facility] is not responsible for any belongings or valuables.  Contacts, dentures or bridgework may not be worn into surgery.  Leave your suitcase in the car.  After surgery it may be brought to your room.  For patients admitted to the hospital, discharge time will be determined by your treatment team.  Patients discharged the day of surgery will not be allowed to drive home.  Special instructions:    [place]- Preparing For Surgery  Before surgery, you can play an important role. Because skin is not sterile, your skin needs to be as free of germs as possible. You can reduce the number of germs on your skin by washing with CHG (chlorahexidine gluconate) Soap before surgery.  CHG is an antiseptic cleaner which  kills germs and bonds with the skin to continue killing germs even after washing.  Please do not use if you have an allergy to CHG or antibacterial soaps. If your skin becomes reddened/irritated stop using the CHG.  Do not shave (including legs and underarms) for at least 48 hours prior to first CHG shower. It is OK to shave your face.  Please follow these instructions carefully.   1. Shower the NIGHT BEFORE SURGERY and the MORNING OF SURGERY with CHG.   2. If you chose to wash your hair, wash your hair first as usual with your normal shampoo.  3. After you shampoo, rinse your hair and body thoroughly to remove the shampoo.  4. Use CHG as you would any other liquid soap. You can apply CHG directly to the skin and wash gently with a scrungie or a clean washcloth.   5. Apply the CHG Soap to your body ONLY FROM THE NECK DOWN.  Do not use on open wounds or open sores. Avoid contact with your eyes, ears, mouth and genitals (private parts). Wash Face and genitals (private parts)  with your normal soap.  6. Wash thoroughly, paying special attention to the area where your surgery will be performed.  7. Thoroughly rinse your body with warm water from the neck down.  8. DO NOT shower/wash with your normal soap after using and rinsing off the CHG Soap.  9. Pat yourself dry  with a CLEAN TOWEL.  10. Wear CLEAN PAJAMAS to bed the night before surgery, wear comfortable clothes the morning of surgery  11. Place CLEAN SHEETS on your bed the night of your first shower and DO NOT SLEEP WITH PETS.    Day of Surgery: Do not apply any deodorants/lotions. Please wear clean clothes to the hospital/surgery center.      Please read over the following fact sheets that you were given.

## 2018-01-31 NOTE — Progress Notes (Signed)
PCP: Dr. Wendi Snipes Cardiologist: Denies  EKG: Today CXR: Denies ECHO: Denies Stress Test: 03-02-17 Cardiac Cath: Denies  Pt plans to attend this Friday's Joint class.  Patient denies shortness of breath, fever, cough, and chest pain at PAT appointment.  Patient verbalized understanding of instructions provided today at the PAT appointment.  Patient asked to review instructions at home and day of surgery.

## 2018-02-01 ENCOUNTER — Encounter (HOSPITAL_COMMUNITY): Payer: Self-pay | Admitting: Emergency Medicine

## 2018-02-01 LAB — URINE CULTURE: Culture: NO GROWTH

## 2018-02-01 NOTE — Progress Notes (Signed)
Anesthesia Chart Review:  Pt is a 67 year old male scheduled for R total knee arthroplasty on 02/05/2018 with Elsie Saas, MD  - PCP is Kenn File, MD  PMH includes: Dyslipidemia, non-Hodgkin's lymphoma.  Never smoker.  BMI 28.  Medications include: Protonix, simvastatin  BP (!) 150/94   Pulse 81   Temp 36.6 C (Oral)   Resp 18   Ht 5\' 10"  (1.778 m)   Wt 197 lb 5 oz (89.5 kg)   SpO2 96%   BMI 28.31 kg/m  - Initial BP 149/94, recheck 150/94. Pt does not have a hx of HTN. Last BP measure at PCP's office 134/83 on 09/18/17.   Preoperative labs reviewed.   CT chest 03/23/17:  1. No active cardiopulmonary abnormalities. 2. Linear scar like densities noted within both lower lobes and within the lingula. 3. Spondylosis noted.  EKG 01/31/18: Sinus rhythm with occasional PVCs  Exercise tolerance test 03/10/17:  Blood pressure demonstrated a normal response to exercise.  There was no ST segment deviation noted during stress. - No signs of underlying CAD, achieved 89% maximal heart rate with no EKG changes. Normal stress treadmill  I will route this note to PCP for f/u elevated BP without dx HTN.   If no changes, I anticipate pt can proceed with surgery as scheduled.   Willeen Cass, FNP-BC [facility]/Anesthesiology Phone: [phone] 02/01/2018 3:03 PM

## 2018-02-05 ENCOUNTER — Inpatient Hospital Stay (HOSPITAL_COMMUNITY): Admission: RE | Admit: 2018-02-05 | Payer: Medicare Other | Source: Ambulatory Visit | Admitting: Orthopedic Surgery

## 2018-02-05 ENCOUNTER — Encounter (HOSPITAL_COMMUNITY): Admission: RE | Payer: Self-pay | Source: Ambulatory Visit

## 2018-02-05 HISTORY — DX: Unilateral primary osteoarthritis, right knee: M17.11

## 2018-02-05 SURGERY — ARTHROPLASTY, KNEE, TOTAL
Anesthesia: Spinal | Laterality: Right

## 2018-02-12 ENCOUNTER — Other Ambulatory Visit: Payer: Self-pay | Admitting: Orthopedic Surgery

## 2018-02-12 NOTE — Care Plan (Signed)
Met with patient and his wife in the office prior to surgery. He is planning to discharge to home with wife and HHPT provided by [facility]. He will be set up with equipement at home prior to surgery by [place]. He will get a RW, 3n1, and CPM.  He will see Dr. Noemi Chapel in follow up on 03/05/18 @ 330 and will begin OPPT at [facility] on 03/06/18 @ 1030.  He is agreeable with the above plan.   Please contact Ladell Heads, [place] with questions or if this plan needs to change.   Thank you

## 2018-02-16 ENCOUNTER — Encounter (HOSPITAL_COMMUNITY): Payer: Self-pay | Admitting: *Deleted

## 2018-02-16 ENCOUNTER — Other Ambulatory Visit: Payer: Self-pay

## 2018-02-16 NOTE — Progress Notes (Signed)
Pt denies SOB, chest pain, and being under the care of a cardiologist. Pt denies having a cardiac cath and echo. Pt stated that history remains the same from previous PAT on 01/31/18. Pt stated that he still has pre-op instructions and verbalized understanding of all instructions.

## 2018-02-18 MED ORDER — TRANEXAMIC ACID 1000 MG/10ML IV SOLN
1000.0000 mg | INTRAVENOUS | Status: AC
  Administered 2018-02-19: 1000 mg via INTRAVENOUS
  Filled 2018-02-18: qty 10

## 2018-02-19 ENCOUNTER — Inpatient Hospital Stay (HOSPITAL_COMMUNITY): Payer: Medicare Other | Admitting: Anesthesiology

## 2018-02-19 ENCOUNTER — Ambulatory Visit (HOSPITAL_COMMUNITY)
Admission: RE | Admit: 2018-02-19 | Discharge: 2018-02-20 | Disposition: A | Discharge status: Home / Self Care | Payer: Medicare Other | Source: Ambulatory Visit | Attending: Orthopedic Surgery | Admitting: Orthopedic Surgery

## 2018-02-19 ENCOUNTER — Encounter (HOSPITAL_COMMUNITY): Payer: Self-pay | Admitting: *Deleted

## 2018-02-19 ENCOUNTER — Other Ambulatory Visit: Payer: Self-pay

## 2018-02-19 ENCOUNTER — Encounter (HOSPITAL_COMMUNITY)
Admission: RE | Disposition: A | Discharge status: Home / Self Care | Payer: Self-pay | Source: Ambulatory Visit | Attending: Orthopedic Surgery

## 2018-02-19 DIAGNOSIS — R05 Cough: Secondary | ICD-10-CM | POA: Insufficient documentation

## 2018-02-19 DIAGNOSIS — K219 Gastro-esophageal reflux disease without esophagitis: Secondary | ICD-10-CM | POA: Diagnosis not present

## 2018-02-19 DIAGNOSIS — Z7982 Long term (current) use of aspirin: Secondary | ICD-10-CM | POA: Insufficient documentation

## 2018-02-19 DIAGNOSIS — R0683 Snoring: Secondary | ICD-10-CM | POA: Insufficient documentation

## 2018-02-19 DIAGNOSIS — G8918 Other acute postprocedural pain: Secondary | ICD-10-CM | POA: Diagnosis not present

## 2018-02-19 DIAGNOSIS — Z8572 Personal history of non-Hodgkin lymphomas: Secondary | ICD-10-CM | POA: Insufficient documentation

## 2018-02-19 DIAGNOSIS — Z79899 Other long term (current) drug therapy: Secondary | ICD-10-CM | POA: Diagnosis not present

## 2018-02-19 DIAGNOSIS — E785 Hyperlipidemia, unspecified: Secondary | ICD-10-CM | POA: Insufficient documentation

## 2018-02-19 DIAGNOSIS — H9312 Tinnitus, left ear: Secondary | ICD-10-CM | POA: Diagnosis not present

## 2018-02-19 DIAGNOSIS — M1711 Unilateral primary osteoarthritis, right knee: Principal | ICD-10-CM | POA: Diagnosis present

## 2018-02-19 DIAGNOSIS — M109 Gout, unspecified: Secondary | ICD-10-CM | POA: Diagnosis not present

## 2018-02-19 DIAGNOSIS — Z833 Family history of diabetes mellitus: Secondary | ICD-10-CM | POA: Diagnosis not present

## 2018-02-19 HISTORY — DX: Non-Hodgkin lymphoma, unspecified, unspecified site: C85.90

## 2018-02-19 HISTORY — DX: Unspecified osteoarthritis, unspecified site: M19.90

## 2018-02-19 HISTORY — DX: Gastro-esophageal reflux disease without esophagitis: K21.9

## 2018-02-19 HISTORY — PX: TOTAL KNEE ARTHROPLASTY: SHX125

## 2018-02-19 SURGERY — ARTHROPLASTY, KNEE, TOTAL
Anesthesia: Spinal | Site: Knee | Laterality: Right

## 2018-02-19 MED ORDER — ACETAMINOPHEN 500 MG PO TABS
1000.0000 mg | ORAL_TABLET | Freq: Four times a day (QID) | ORAL | Status: AC
  Administered 2018-02-19 – 2018-02-20 (×3): 1000 mg via ORAL
  Filled 2018-02-19 (×3): qty 2

## 2018-02-19 MED ORDER — SODIUM CHLORIDE 0.9 % IR SOLN
Status: DC | PRN
  Administered 2018-02-19: 3000 mL

## 2018-02-19 MED ORDER — POTASSIUM CHLORIDE IN NACL 20-0.9 MEQ/L-% IV SOLN
INTRAVENOUS | Status: DC
  Administered 2018-02-19 – 2018-02-20 (×2): via INTRAVENOUS
  Filled 2018-02-19 (×2): qty 1000

## 2018-02-19 MED ORDER — DEXAMETHASONE SODIUM PHOSPHATE 10 MG/ML IJ SOLN
10.0000 mg | Freq: Three times a day (TID) | INTRAMUSCULAR | Status: DC
  Administered 2018-02-19 – 2018-02-20 (×3): 10 mg via INTRAVENOUS
  Filled 2018-02-19 (×3): qty 1

## 2018-02-19 MED ORDER — FENTANYL CITRATE (PF) 100 MCG/2ML IJ SOLN
INTRAMUSCULAR | Status: AC
  Administered 2018-02-19: 50 ug via INTRAVENOUS
  Filled 2018-02-19: qty 2

## 2018-02-19 MED ORDER — BUPIVACAINE-EPINEPHRINE 0.5% -1:200000 IJ SOLN
INTRAMUSCULAR | Status: DC | PRN
  Administered 2018-02-19: 50 mL

## 2018-02-19 MED ORDER — EPHEDRINE 5 MG/ML INJ
INTRAVENOUS | Status: AC
  Filled 2018-02-19: qty 10

## 2018-02-19 MED ORDER — CEFAZOLIN SODIUM-DEXTROSE 2-4 GM/100ML-% IV SOLN
2.0000 g | Freq: Four times a day (QID) | INTRAVENOUS | Status: AC
  Administered 2018-02-19 – 2018-02-20 (×2): 2 g via INTRAVENOUS
  Filled 2018-02-19 (×2): qty 100

## 2018-02-19 MED ORDER — DEXAMETHASONE SODIUM PHOSPHATE 10 MG/ML IJ SOLN
INTRAMUSCULAR | Status: AC
Start: 2018-02-19 — End: 2018-02-19
  Filled 2018-02-19: qty 1

## 2018-02-19 MED ORDER — DOCUSATE SODIUM 100 MG PO CAPS
100.0000 mg | ORAL_CAPSULE | Freq: Two times a day (BID) | ORAL | Status: DC
  Administered 2018-02-19 – 2018-02-20 (×2): 100 mg via ORAL
  Filled 2018-02-19 (×2): qty 1

## 2018-02-19 MED ORDER — ONDANSETRON HCL 4 MG PO TABS: 4.0000 mg | ORAL_TABLET | Freq: Four times a day (QID) | ORAL | Status: DC | PRN

## 2018-02-19 MED ORDER — FENTANYL CITRATE (PF) 100 MCG/2ML IJ SOLN
INTRAMUSCULAR | Status: DC | PRN
  Administered 2018-02-19: 100 ug via INTRAVENOUS

## 2018-02-19 MED ORDER — SIMVASTATIN 20 MG PO TABS
20.0000 mg | ORAL_TABLET | Freq: Every day | ORAL | Status: DC
  Administered 2018-02-19: 20 mg via ORAL
  Filled 2018-02-19: qty 1

## 2018-02-19 MED ORDER — LIDOCAINE 2% (20 MG/ML) 5 ML SYRINGE
INTRAMUSCULAR | Status: AC
  Filled 2018-02-19: qty 5

## 2018-02-19 MED ORDER — MIDAZOLAM HCL 2 MG/2ML IJ SOLN
1.0000 mg | Freq: Once | INTRAMUSCULAR | Status: AC
  Administered 2018-02-19: 1 mg via INTRAVENOUS

## 2018-02-19 MED ORDER — BUPIVACAINE LIPOSOME 1.3 % IJ SUSP
20.0000 mL | INTRAMUSCULAR | Status: AC
  Administered 2018-02-19: 20 mL
  Filled 2018-02-19: qty 20

## 2018-02-19 MED ORDER — POVIDONE-IODINE 7.5 % EX SOLN
Freq: Once | CUTANEOUS | Status: DC
  Filled 2018-02-19: qty 118

## 2018-02-19 MED ORDER — ALLOPURINOL 300 MG PO TABS
300.0000 mg | ORAL_TABLET | Freq: Every day | ORAL | Status: DC
  Administered 2018-02-20: 300 mg via ORAL
  Filled 2018-02-19: qty 1

## 2018-02-19 MED ORDER — DEXAMETHASONE SODIUM PHOSPHATE 10 MG/ML IJ SOLN
INTRAMUSCULAR | Status: DC | PRN
  Administered 2018-02-19: 10 mg via INTRAVENOUS

## 2018-02-19 MED ORDER — SODIUM CHLORIDE 0.9 % IJ SOLN
INTRAMUSCULAR | Status: DC | PRN
  Administered 2018-02-19: 50 mL

## 2018-02-19 MED ORDER — DIPHENHYDRAMINE HCL 12.5 MG/5ML PO ELIX: 12.5000 mg | ORAL_SOLUTION | ORAL | Status: DC | PRN

## 2018-02-19 MED ORDER — ASPIRIN EC 325 MG PO TBEC
325.0000 mg | DELAYED_RELEASE_TABLET | Freq: Every day | ORAL | Status: DC
  Administered 2018-02-20: 325 mg via ORAL
  Filled 2018-02-19: qty 1

## 2018-02-19 MED ORDER — POLYETHYLENE GLYCOL 3350 17 G PO PACK
17.0000 g | PACK | Freq: Two times a day (BID) | ORAL | Status: DC
  Administered 2018-02-19 – 2018-02-20 (×2): 17 g via ORAL
  Filled 2018-02-19 (×2): qty 1

## 2018-02-19 MED ORDER — FENTANYL CITRATE (PF) 250 MCG/5ML IJ SOLN
INTRAMUSCULAR | Status: AC
  Filled 2018-02-19: qty 5

## 2018-02-19 MED ORDER — PHENOL 1.4 % MT LIQD: 1.0000 | OROMUCOSAL | Status: DC | PRN

## 2018-02-19 MED ORDER — MIDAZOLAM HCL 2 MG/2ML IJ SOLN
INTRAMUSCULAR | Status: AC
  Administered 2018-02-19: 1 mg via INTRAVENOUS
  Filled 2018-02-19: qty 2

## 2018-02-19 MED ORDER — OXYCODONE HCL 5 MG PO TABS
5.0000 mg | ORAL_TABLET | ORAL | Status: DC | PRN
  Administered 2018-02-19 – 2018-02-20 (×3): 10 mg via ORAL
  Administered 2018-02-20: 5 mg via ORAL
  Administered 2018-02-20: 10 mg via ORAL
  Filled 2018-02-19 (×2): qty 2
  Filled 2018-02-19: qty 1
  Filled 2018-02-19 (×2): qty 2
  Filled 2018-02-19: qty 1

## 2018-02-19 MED ORDER — ONDANSETRON HCL 4 MG/2ML IJ SOLN
INTRAMUSCULAR | Status: AC
  Filled 2018-02-19: qty 2

## 2018-02-19 MED ORDER — 0.9 % SODIUM CHLORIDE (POUR BTL) OPTIME
TOPICAL | Status: DC | PRN
  Administered 2018-02-19: 1000 mL

## 2018-02-19 MED ORDER — GABAPENTIN 300 MG PO CAPS
300.0000 mg | ORAL_CAPSULE | Freq: Every day | ORAL | Status: DC
  Administered 2018-02-19: 300 mg via ORAL
  Filled 2018-02-19: qty 1

## 2018-02-19 MED ORDER — ONDANSETRON HCL 4 MG/2ML IJ SOLN: 4.0000 mg | Freq: Once | INTRAMUSCULAR | Status: DC | PRN

## 2018-02-19 MED ORDER — HYDROMORPHONE HCL 2 MG/ML IJ SOLN
0.5000 mg | INTRAMUSCULAR | Status: DC | PRN
  Administered 2018-02-19: 1 mg via INTRAVENOUS
  Filled 2018-02-19: qty 1

## 2018-02-19 MED ORDER — METOCLOPRAMIDE HCL 5 MG/ML IJ SOLN: 5.0000 mg | Freq: Three times a day (TID) | INTRAMUSCULAR | Status: DC | PRN

## 2018-02-19 MED ORDER — ONDANSETRON HCL 4 MG/2ML IJ SOLN
INTRAMUSCULAR | Status: DC | PRN
  Administered 2018-02-19: 4 mg via INTRAVENOUS

## 2018-02-19 MED ORDER — OXYCODONE HCL 5 MG PO TABS: 5.0000 mg | ORAL_TABLET | Freq: Once | ORAL | Status: DC | PRN

## 2018-02-19 MED ORDER — PROPOFOL 500 MG/50ML IV EMUL
INTRAVENOUS | Status: DC | PRN
  Administered 2018-02-19: 50 ug/kg/min via INTRAVENOUS

## 2018-02-19 MED ORDER — MENTHOL 3 MG MT LOZG: 1.0000 | LOZENGE | OROMUCOSAL | Status: DC | PRN

## 2018-02-19 MED ORDER — ONDANSETRON HCL 4 MG/2ML IJ SOLN
4.0000 mg | Freq: Four times a day (QID) | INTRAMUSCULAR | Status: DC | PRN
  Administered 2018-02-19: 4 mg via INTRAVENOUS
  Filled 2018-02-19: qty 2

## 2018-02-19 MED ORDER — BUPIVACAINE IN DEXTROSE 0.75-8.25 % IT SOLN
INTRATHECAL | Status: DC | PRN
  Administered 2018-02-19: 2 mL via INTRATHECAL

## 2018-02-19 MED ORDER — FENTANYL CITRATE (PF) 100 MCG/2ML IJ SOLN: 25.0000 ug | INTRAMUSCULAR | Status: DC | PRN

## 2018-02-19 MED ORDER — PANTOPRAZOLE SODIUM 40 MG PO TBEC
40.0000 mg | DELAYED_RELEASE_TABLET | Freq: Every day | ORAL | Status: DC
  Administered 2018-02-20: 40 mg via ORAL
  Filled 2018-02-19: qty 1

## 2018-02-19 MED ORDER — CEFAZOLIN SODIUM-DEXTROSE 2-4 GM/100ML-% IV SOLN
2.0000 g | INTRAVENOUS | Status: AC
  Administered 2018-02-19: 2 g via INTRAVENOUS
  Filled 2018-02-19: qty 100

## 2018-02-19 MED ORDER — ALUM & MAG HYDROXIDE-SIMETH 200-200-20 MG/5ML PO SUSP: 30.0000 mL | ORAL | Status: DC | PRN

## 2018-02-19 MED ORDER — HYDROMORPHONE HCL 1 MG/ML IJ SOLN: 0.5000 mg | INTRAMUSCULAR | Status: DC | PRN

## 2018-02-19 MED ORDER — FENTANYL CITRATE (PF) 100 MCG/2ML IJ SOLN
50.0000 ug | Freq: Once | INTRAMUSCULAR | Status: AC
  Administered 2018-02-19: 50 ug via INTRAVENOUS

## 2018-02-19 MED ORDER — BUPIVACAINE-EPINEPHRINE (PF) 0.5% -1:200000 IJ SOLN
INTRAMUSCULAR | Status: DC | PRN
  Administered 2018-02-19: 30 mL via PERINEURAL

## 2018-02-19 MED ORDER — LACTATED RINGERS IV SOLN
INTRAVENOUS | Status: DC
  Administered 2018-02-19 (×2): via INTRAVENOUS

## 2018-02-19 MED ORDER — OXYCODONE HCL 5 MG/5ML PO SOLN: 5.0000 mg | Freq: Once | ORAL | Status: DC | PRN

## 2018-02-19 MED ORDER — METOCLOPRAMIDE HCL 5 MG PO TABS: 5.0000 mg | ORAL_TABLET | Freq: Three times a day (TID) | ORAL | Status: DC | PRN

## 2018-02-19 SURGICAL SUPPLY — 67 items
BANDAGE ACE 6X5 VEL STRL LF (GAUZE/BANDAGES/DRESSINGS) ×2 IMPLANT
BANDAGE ESMARK 6X9 LF (GAUZE/BANDAGES/DRESSINGS) ×1 IMPLANT
BENZOIN TINCTURE PRP APPL 2/3 (GAUZE/BANDAGES/DRESSINGS) ×2 IMPLANT
BLADE SAGITTAL 25.0X1.19X90 (BLADE) ×2 IMPLANT
BLADE SAW SGTL 13X75X1.27 (BLADE) ×2 IMPLANT
BLADE SURG 10 STRL SS (BLADE) ×8 IMPLANT
BNDG ELASTIC 6X15 VLCR STRL LF (GAUZE/BANDAGES/DRESSINGS) ×2 IMPLANT
BNDG ESMARK 6X9 LF (GAUZE/BANDAGES/DRESSINGS) ×2
BOWL SMART MIX CTS (DISPOSABLE) ×2 IMPLANT
CAPT KNEE TOTAL 3 ATTUNE ×2 IMPLANT
CEMENT HV SMART SET (Cement) ×4 IMPLANT
CLSR STERI-STRIP ANTIMIC 1/2X4 (GAUZE/BANDAGES/DRESSINGS) ×2 IMPLANT
COVER SURGICAL LIGHT HANDLE (MISCELLANEOUS) ×2 IMPLANT
CUFF TOURNIQUET SINGLE 34IN LL (TOURNIQUET CUFF) ×2 IMPLANT
CUFF TOURNIQUET SINGLE 44IN (TOURNIQUET CUFF) IMPLANT
DECANTER SPIKE VIAL GLASS SM (MISCELLANEOUS) ×2 IMPLANT
DRAPE EXTREMITY T 121X128X90 (DRAPE) ×2 IMPLANT
DRAPE HALF SHEET 40X57 (DRAPES) ×4 IMPLANT
DRAPE INCISE IOBAN 66X45 STRL (DRAPES) IMPLANT
DRAPE ORTHO SPLIT 77X108 STRL (DRAPES) ×1
DRAPE SURG ORHT 6 SPLT 77X108 (DRAPES) ×1 IMPLANT
DRAPE U-SHAPE 47X51 STRL (DRAPES) ×2 IMPLANT
DRSG AQUACEL AG ADV 3.5X10 (GAUZE/BANDAGES/DRESSINGS) ×2 IMPLANT
DURAPREP 26ML APPLICATOR (WOUND CARE) ×2 IMPLANT
ELECT CAUTERY BLADE 6.4 (BLADE) ×2 IMPLANT
ELECT REM PT RETURN 9FT ADLT (ELECTROSURGICAL) ×2
ELECTRODE REM PT RTRN 9FT ADLT (ELECTROSURGICAL) ×1 IMPLANT
FACESHIELD WRAPAROUND (MASK) ×2 IMPLANT
GLOVE BIO SURGEON STRL SZ7 (GLOVE) ×2 IMPLANT
GLOVE BIOGEL PI IND STRL 7.0 (GLOVE) ×1 IMPLANT
GLOVE BIOGEL PI IND STRL 7.5 (GLOVE) ×1 IMPLANT
GLOVE BIOGEL PI INDICATOR 7.0 (GLOVE) ×1
GLOVE BIOGEL PI INDICATOR 7.5 (GLOVE) ×1
GLOVE SS BIOGEL STRL SZ 7.5 (GLOVE) ×1 IMPLANT
GLOVE SUPERSENSE BIOGEL SZ 7.5 (GLOVE) ×1
GOWN STRL REUS W/ TWL LRG LVL3 (GOWN DISPOSABLE) ×1 IMPLANT
GOWN STRL REUS W/ TWL XL LVL3 (GOWN DISPOSABLE) ×1 IMPLANT
GOWN STRL REUS W/TWL LRG LVL3 (GOWN DISPOSABLE) ×1
GOWN STRL REUS W/TWL XL LVL3 (GOWN DISPOSABLE) ×1
HANDPIECE INTERPULSE COAX TIP (DISPOSABLE) ×1
HOOD PEEL AWAY FACE SHEILD DIS (HOOD) ×4 IMPLANT
IMMOBILIZER KNEE 22 UNIV (SOFTGOODS) ×2 IMPLANT
KIT BASIN OR (CUSTOM PROCEDURE TRAY) ×2 IMPLANT
KIT TURNOVER KIT B (KITS) ×2 IMPLANT
MANIFOLD NEPTUNE II (INSTRUMENTS) ×2 IMPLANT
MARKER SKIN DUAL TIP RULER LAB (MISCELLANEOUS) ×2 IMPLANT
NEEDLE HYPO 22GX1.5 SAFETY (NEEDLE) ×4 IMPLANT
NS IRRIG 1000ML POUR BTL (IV SOLUTION) ×2 IMPLANT
PACK TOTAL JOINT (CUSTOM PROCEDURE TRAY) ×2 IMPLANT
PAD ARMBOARD 7.5X6 YLW CONV (MISCELLANEOUS) ×4 IMPLANT
SET HNDPC FAN SPRY TIP SCT (DISPOSABLE) ×1 IMPLANT
STRIP CLOSURE SKIN 1/2X4 (GAUZE/BANDAGES/DRESSINGS) ×2 IMPLANT
SUCTION FRAZIER HANDLE 10FR (MISCELLANEOUS) ×1
SUCTION TUBE FRAZIER 10FR DISP (MISCELLANEOUS) ×1 IMPLANT
SUT MNCRL AB 3-0 PS2 18 (SUTURE) ×2 IMPLANT
SUT VIC AB 0 CT1 27 (SUTURE) ×2
SUT VIC AB 0 CT1 27XBRD ANBCTR (SUTURE) ×2 IMPLANT
SUT VIC AB 1 CT1 27 (SUTURE) ×1
SUT VIC AB 1 CT1 27XBRD ANBCTR (SUTURE) ×1 IMPLANT
SUT VIC AB 2-0 CT1 27 (SUTURE) ×2
SUT VIC AB 2-0 CT1 TAPERPNT 27 (SUTURE) ×2 IMPLANT
SYR CONTROL 10ML LL (SYRINGE) ×4 IMPLANT
TOWEL OR 17X24 6PK STRL BLUE (TOWEL DISPOSABLE) ×2 IMPLANT
TOWEL OR 17X26 10 PK STRL BLUE (TOWEL DISPOSABLE) ×2 IMPLANT
TRAY CATH 16FR W/PLASTIC CATH (SET/KITS/TRAYS/PACK) IMPLANT
TRAY FOLEY CATH SILVER 16FR (SET/KITS/TRAYS/PACK) ×2 IMPLANT
WATER STERILE IRR 1000ML POUR (IV SOLUTION) ×2 IMPLANT

## 2018-02-19 NOTE — Progress Notes (Signed)
Spoke with Dr. Fransisco Beau, labs done at [facility] 01/31/18. Dr. Fransisco Beau okay not to repeat labs today.

## 2018-02-19 NOTE — Anesthesia Procedure Notes (Signed)
Anesthesia Regional Block: Adductor canal block   Pre-Anesthetic Checklist: ,, timeout performed, Correct Patient, Correct Site, Correct Laterality, Correct Procedure, Correct Position, site marked, Risks and benefits discussed,  Surgical consent,  Pre-op evaluation,  At surgeon's request and post-op pain management  Laterality: Right  Prep: chloraprep       Needles:  Injection technique: Single-shot  Needle Type: Echogenic Needle     Needle Length: 10cm  Needle Gauge: 21     Additional Needles:   Narrative:  Start time: 02/19/2018 8:38 AM End time: 02/19/2018 8:42 AM Injection made incrementally with aspirations every 5 mL.  Performed by: Personally  Anesthesiologist: Audry Pili, MD  Additional Notes: No pain on injection. No increased resistance to injection. Injection made in 5cc increments. Good needle visualization. Patient tolerated the procedure well.

## 2018-02-19 NOTE — Anesthesia Postprocedure Evaluation (Signed)
Anesthesia Post Note  Patient: [name]  Procedure(s) Performed: TOTAL KNEE ARTHROPLASTY (Right Knee)     Patient location during evaluation: PACU Anesthesia Type: Spinal Level of consciousness: awake and alert Pain management: pain level controlled Vital Signs Assessment: post-procedure vital signs reviewed and stable Respiratory status: spontaneous breathing and respiratory function stable Cardiovascular status: blood pressure returned to baseline and stable Postop Assessment: spinal receding and no apparent nausea or vomiting Anesthetic complications: no    Last Vitals:  Vitals:   02/19/18 1150 02/19/18 1205  BP: (!) 95/51 111/64  Pulse: 77 68  Resp: 17 15  Temp:    SpO2: 95% 97%    Last Pain:  Vitals:   02/19/18 1205  PainSc: 0-No pain                 Audry Pili

## 2018-02-19 NOTE — Transfer of Care (Signed)
Immediate Anesthesia Transfer of Care Note  Patient: [name]  Procedure(s) Performed: TOTAL KNEE ARTHROPLASTY (Right Knee)  Patient Location: PACU  Anesthesia Type:MAC  Level of Consciousness: awake and patient cooperative  Airway & Oxygen Therapy: Patient Spontanous Breathing and Patient connected to nasal cannula oxygen  Post-op Assessment: Report given to RN and Post -op Vital signs reviewed and stable  Post vital signs: Reviewed and stable  Last Vitals:  Vitals Value Taken Time  BP 114/67 02/19/2018 11:35 AM  Temp    Pulse 82 02/19/2018 11:40 AM  Resp 14 02/19/2018 11:40 AM  SpO2 96 % 02/19/2018 11:40 AM  Vitals shown include unvalidated device data.  Last Pain:  Vitals:   02/19/18 0900  PainSc: 0-No pain         Complications: No apparent anesthesia complications

## 2018-02-19 NOTE — Progress Notes (Signed)
Orthopedic Tech Progress Note Patient Details:  [name] 02/26/1951 [id number]  CPM Right Knee CPM Right Knee: On Right Knee Flexion (Degrees): 90 Right Knee Extension (Degrees): 0  Post Interventions Patient Tolerated: Well Instructions Provided: Care of device  Hildred Priest 02/19/2018, 12:01 PM

## 2018-02-19 NOTE — Anesthesia Procedure Notes (Signed)
Spinal  Patient location during procedure: OR Start time: 02/19/2018 9:35 AM End time: 02/19/2018 9:39 AM Staffing Anesthesiologist: Audry Pili, MD Performed: anesthesiologist  Preanesthetic Checklist Completed: patient identified, surgical consent, pre-op evaluation, timeout performed, IV checked, risks and benefits discussed and monitors and equipment checked Spinal Block Patient position: sitting Prep: DuraPrep Patient monitoring: heart rate, cardiac monitor, continuous pulse ox and blood pressure Approach: midline Location: L3-4 Injection technique: single-shot Needle Needle type: Pencan  Needle gauge: 24 G Additional Notes Functioning IV was confirmed and monitors were applied. Sterile prep and drape, including hand hygiene, mask, and sterile gloves were used. The patient was positioned and the spine was prepped. The skin was anesthetized with lidocaine. Free flow of clear CSF was obtained prior to injecting local anesthetic into the CSF. The spinal needle aspirated freely following injection. The needle was carefully withdrawn. The patient tolerated the procedure well. Consent was obtained prior to the procedure with all questions answered and concerns addressed. Risks including, but not limited to, bleeding, infection, nerve damage, paralysis, failed block, inadequate analgesia, allergic reaction, high spinal, itching, and headache were discussed and the patient wished to proceed.  Renold Don, MD

## 2018-02-19 NOTE — Interval H&P Note (Signed)
History and Physical Interval Note:  02/19/2018 7:42 AM  [name]  has presented today for surgery, with the diagnosis of djd right knee  The various methods of treatment have been discussed with the patient and family. After consideration of risks, benefits and other options for treatment, the patient has consented to  Procedure(s): TOTAL KNEE ARTHROPLASTY (Right) as a surgical intervention .  The patient's history has been reviewed, patient examined, no change in status, stable for surgery.  I have reviewed the patient's chart and labs.  Questions were answered to the patient's satisfaction.     [name]

## 2018-02-19 NOTE — Evaluation (Addendum)
Physical Therapy Evaluation Patient Details Name: [name] MRN: [id number] DOB: [birth year] Today's Date: 02/19/2018   History of Present Illness  Pt is a 67 y/o male s/p elective R TKA. PMH includes gout.   Clinical Impression  Pt is s/p surgery above with deficits below. Pt tolerated gait well this session with RW. Mild unsteadiness noted and required min to min guard A for mobility. Educated about knee precautions and supine HEP. Will continue to follow acutely to maximize functional mobility independence and safety.     Follow Up Recommendations Follow surgeon's recommendation for DC plan and follow-up therapies;Supervision for mobility/OOB    Equipment Recommendations  None recommended by PT    Recommendations for Other Services       Precautions / Restrictions Precautions Precautions: Knee Precaution Booklet Issued: Yes (comment) Precaution Comments: Reviewed knee precautions and supine HEP.  Required Braces or Orthoses: Knee Immobilizer - Right Knee Immobilizer - Right: Other (comment)(until good quad control) Restrictions Weight Bearing Restrictions: Yes RLE Weight Bearing: Weight bearing as tolerated      Mobility  Bed Mobility Overal bed mobility: Needs Assistance Bed Mobility: Supine to Sit     Supine to sit: Supervision     General bed mobility comments: Supervision for safety.   Transfers Overall transfer level: Needs assistance Equipment used: Rolling walker (2 wheeled) Transfers: Sit to/from Stand Sit to Stand: Min assist         General transfer comment: Min A for steadying assist. Verbal cues for safe hand placement.   Ambulation/Gait Ambulation/Gait assistance: Min assist;Min guard Ambulation Distance (Feet): 100 Feet Assistive device: Rolling walker (2 wheeled) Gait Pattern/deviations: Step-to pattern;Step-through pattern;Decreased weight shift to right;Antalgic;Decreased step length - right;Decreased step length - left Gait  velocity: Decreased  Gait velocity interpretation: <1.31 ft/sec, indicative of household ambulator General Gait Details: Slow, antalgic gait. Able to progress to step through pattern by end of gait, however, continues to demonstrate decreased weightshift to R. Verbal cues for sequencing using RW.   Stairs            Wheelchair Mobility    Modified Rankin (Stroke Patients Only)       Balance Overall balance assessment: Needs assistance Sitting-balance support: No upper extremity supported;Feet supported Sitting balance-Leahy Scale: Good     Standing balance support: Bilateral upper extremity supported;During functional activity Standing balance-Leahy Scale: Poor Standing balance comment: Reliant on BUE support                              Pertinent Vitals/Pain Pain Assessment: Faces Faces Pain Scale: Hurts little more Pain Location: R knee  Pain Descriptors / Indicators: Aching;Operative site guarding Pain Intervention(s): Monitored during session;Limited activity within patient's tolerance;Repositioned    Home Living Family/patient expects to be discharged to:: Private residence Living Arrangements: Spouse/significant other Available Help at Discharge: Family;Available 24 hours/day Type of Home: House Home Access: Stairs to enter Entrance Stairs-Rails: Right Entrance Stairs-Number of Steps: 6 Home Layout: Two level;Able to live on main level with bedroom/bathroom Home Equipment: Gilford Rile - 2 wheels;Bedside commode      Prior Function Level of Independence: Independent               Hand Dominance        Extremity/Trunk Assessment   Upper Extremity Assessment Upper Extremity Assessment: Defer to OT evaluation    Lower Extremity Assessment Lower Extremity Assessment: RLE deficits/detail RLE Deficits / Details: Sensory in tact. Able  to perform ther ex below. Deficits consistent with post op pain and weakness.        Communication    Communication: No difficulties  Cognition Arousal/Alertness: Awake/alert Behavior During Therapy: WFL for tasks assessed/performed Overall Cognitive Status: Within Functional Limits for tasks assessed                                        General Comments General comments (skin integrity, edema, etc.): Pt's wife present during session.     Exercises Total Joint Exercises Ankle Circles/Pumps: AROM;Both;20 reps Quad Sets: AROM;Right;10 reps Towel Squeeze: AROM;Both;10 reps Heel Slides: AROM;Right;10 reps Straight Leg Raises: AROM;Right;5 reps(demonstrated good quad control )   Assessment/Plan    PT Assessment Patient needs continued PT services  PT Problem List Decreased strength;Decreased balance;Decreased mobility;Decreased knowledge of use of DME;Decreased knowledge of precautions;Pain;Decreased activity tolerance       PT Treatment Interventions Gait training;DME instruction;Stair training;Therapeutic activities;Functional mobility training;Therapeutic exercise;Balance training;Neuromuscular re-education;Patient/family education    PT Goals (Current goals can be found in the Care Plan section)  Acute Rehab PT Goals Patient Stated Goal: to go home  PT Goal Formulation: With patient Time For Goal Achievement: 03/05/18 Potential to Achieve Goals: Good    Frequency 7X/week   Barriers to discharge        Co-evaluation               AM-PAC PT "6 Clicks" Daily Activity  Outcome Measure Difficulty turning over in bed (including adjusting bedclothes, sheets and blankets)?: A Little Difficulty moving from lying on back to sitting on the side of the bed? : A Little Difficulty sitting down on and standing up from a chair with arms (e.g., wheelchair, bedside commode, etc,.)?: Unable Help needed moving to and from a bed to chair (including a wheelchair)?: A Little Help needed walking in hospital room?: A Little Help needed climbing 3-5 steps with a railing?  : A Lot 6 Click Score: 15    End of Session Equipment Utilized During Treatment: Gait belt Activity Tolerance: Patient tolerated treatment well Patient left: in chair;with call bell/phone within reach;with family/visitor present Nurse Communication: Mobility status PT Visit Diagnosis: Other abnormalities of gait and mobility (R26.89);Pain Pain - Right/Left: Right Pain - part of body: Knee    Time: 2878-6767 PT Time Calculation (min) (ACUTE ONLY): 40 min   Charges:   PT Evaluation $PT Eval Low Complexity: 1 Low PT Treatments $Gait Training: 8-22 mins $Therapeutic Exercise: 8-22 mins   PT G Codes:        Leighton Ruff, PT, DPT  Acute Rehabilitation Services  Pager: [phone]   Rudean Hitt 02/19/2018, 4:58 PM

## 2018-02-19 NOTE — Op Note (Signed)
MRN:     [id number] DOB/AGE:    Dec 10, 1950 / 67 y.o.       OPERATIVE REPORT   DATE OF PROCEDURE:  02/19/2018      PREOPERATIVE DIAGNOSIS:   Primary Localized Osteoarthritis right Knee       Estimated body mass index is 28.31 kg/m as calculated from the following:   Height as of this encounter: 5\' 10"  (1.778 m).   Weight as of this encounter: 89.5 kg (197 lb 5 oz).                                                       POSTOPERATIVE DIAGNOSIS:   Same                                                                 PROCEDURE:  Procedure(s): TOTAL KNEE ARTHROPLASTY Using Depuy Attune RP implants #5 Femur, #7Tibia, 74mm  RP bearing, 41 Patella    SURGEON: Dencil Cayson A. Noemi Chapel, MD   ASSISTANT: Matthew Saras, PA-C, present and scrubbed throughout the case, critical for retraction, instrumentation, and closure.  ANESTHESIA: Spinal with Adductor Nerve Block  TOURNIQUET TIME: 55 minutes   COMPLICATIONS:  None       SPECIMENS: None   INDICATIONS FOR PROCEDURE: The patient has djd of the knee with varus deformities, XR shows bone on bone arthritis. Patient has failed all conservative measures including anti-inflammatory medicines, narcotics, attempts at exercise and weight loss, cortisone injections and viscosupplementation.  Risks and benefits of surgery have been discussed, questions answered.    DESCRIPTION OF PROCEDURE: The patient identified by armband, received right adductor canal block and IV antibiotics, in the holding area at [facility]. Patient taken to the operating room, appropriate anesthetic monitors were attached. Spinal anesthesia induced with the patient in supine position, Foley catheter was inserted. Tourniquet applied high to the operative thigh. Lateral post and foot positioner applied to the table, the lower extremity was then prepped and draped in usual sterile fashion from the ankle to the tourniquet. Time-out procedure was performed. The limb was wrapped with an  Esmarch bandage and the tourniquet inflated to 365 mmHg.   We began the operation by making a 6cm anterior midline incision. Small bleeders in the skin and the subcutaneous tissue identified and cauterized. Transverse retinaculum was incised and reflected medially and a medial parapatellar arthrotomy was accomplished. the patella was everted and theprepatellar fat pad resected. The superficial medial collateral ligament was then elevated from anterior to posterior along the proximal flare of the tibia and anterior half of the menisci resected. The knee was hyperflexed exposing bone on bone arthritis. Peripheral and notch osteophytes as well as the cruciate ligaments were then resected. We continued to work our way around posteriorly along the proximal tibia, and externally rotated the tibia subluxing it out from underneath the femur. A McHale retractor was placed through the notch and a lateral Hohmann retractor placed, and an external tibial guide was placed.  The tibial cutting guide was pinned into place allowing resection of 4 mm of bone medially and about 6 mm of bone  laterally because of her varus deformity.   Satisfied with the tibial resection, we then entered the distal femur 2 mm anterior to the PCL origin with the intramedullary guide rod and applied the distal femoral cutting guide set at 68mm, with 5 degrees of valgus. This was pinned along the epicondylar axis. At this point, the distal femoral cut was accomplished without difficulty. We then sized for a 5 femoral component and pinned the guide in 3 degrees of external rotation.The chamfer cutting guide was pinned into place. The anterior, posterior, and chamfer cuts were accomplished without difficulty followed by the  RP box cutting guide and the box cut. We also removed posterior osteophytes from the posterior femoral condyles. At this time, the knee was brought into full extension. We checked our extension and flexion gaps and found them  symmetric at 7.  The patella thickness measured at 56m m. We set the cutting guide at 15 and removed the posterior patella sized for 41 button and drilled the lollipop. The knee was then once again hyperflexed exposing the proximal tibia. We sized for a # 7 tibial base plate, applied the smokestack and the conical reamer followed by the the Delta fin keel punch. We then hammered into place the  RP trial femoral component, inserted a trial bearing, trial patellar button, and took the knee through range of motion from 0-130 degrees. No thumb pressure was required for patellar tracking.   At this point, all trial components were removed, a double batch of DePuy HV cement  was mixed and applied to all bony metallic mating surfaces. In order, we hammered into place the tibial tray and removed excess cement, the femoral component and removed excess cement, a 7 mm  RP bearing was inserted, and the knee brought to full extension with compression. The patellar button was clamped into place, and excess cement removed. While the cement cured the wound was irrigated out with normal saline solution pulse lavage, and exparel was injected throughout the knee. Ligament stability and patellar tracking were checked and found to be excellent..   The parapatellar arthrotomy was closed with  #1 Vicryl suture. The subcutaneous tissue with 0 and 2-0 undyed Vicryl suture, and 4-0 Monocryl.. A dressing of Aquaseal, 4 x 4, dressing sponges, Webril, and Ace wrap applied. Needle and sponge count were correct times 2.The patient awakened, extubated, and taken to recovery room without difficulty. Vascular status was normal, pulses 2+ and symmetric.    [name] 01/22/2018, 8:56 AM

## 2018-02-19 NOTE — Plan of Care (Signed)

## 2018-02-19 NOTE — Anesthesia Procedure Notes (Signed)
Procedure Name: MAC Date/Time: 02/19/2018 9:43 AM Performed by: Lowella Dell, CRNA Pre-anesthesia Checklist: Patient identified, Emergency Drugs available, Suction available, Patient being monitored and Timeout performed Patient Re-evaluated:Patient Re-evaluated prior to induction Oxygen Delivery Method: Nasal cannula Induction Type: IV induction Placement Confirmation: positive ETCO2 Dental Injury: Teeth and Oropharynx as per pre-operative assessment

## 2018-02-19 NOTE — Anesthesia Preprocedure Evaluation (Addendum)
Anesthesia Evaluation  Patient identified by MRN, date of birth, ID band Patient awake    Reviewed: Allergy & Precautions, NPO status , Patient's Chart, lab work & pertinent test results  Airway Mallampati: II  TM Distance: >3 FB Neck ROM: Full    Dental  (+) Dental Advisory Given, Teeth Intact   Pulmonary  Snores - no formal dx of OSA   Pulmonary exam normal breath sounds clear to auscultation       Cardiovascular Normal cardiovascular exam Rhythm:Regular Rate:Normal  Elevated Bps 150-160's for past several months, not yet started on antihypertensives   Neuro/Psych Left ear tinnitus negative psych ROS   GI/Hepatic Neg liver ROS, GERD  Controlled,  Endo/Other  negative endocrine ROS  Renal/GU negative Renal ROS  negative genitourinary   Musculoskeletal  (+) Arthritis , Osteoarthritis,  Gout   Abdominal   Peds  Hematology negative hematology ROS (+)   Anesthesia Other Findings Non-Hodgkin's lymphoma  Reproductive/Obstetrics                            Anesthesia Physical Anesthesia Plan  ASA: II  Anesthesia Plan: Spinal   Post-op Pain Management:  Regional for Post-op pain   Induction:   PONV Risk Score and Plan: Treatment may vary due to age or medical condition and Propofol infusion  Airway Management Planned: Natural Airway and Simple Face Mask  Additional Equipment: None  Intra-op Plan:   Post-operative Plan:   Informed Consent: I have reviewed the patients History and Physical, chart, labs and discussed the procedure including the risks, benefits and alternatives for the proposed anesthesia with the patient or authorized representative who has indicated his/her understanding and acceptance.     Plan Discussed with: CRNA and Anesthesiologist  Anesthesia Plan Comments:         Anesthesia Quick Evaluation

## 2018-02-20 ENCOUNTER — Encounter (HOSPITAL_COMMUNITY): Payer: Self-pay | Admitting: Orthopedic Surgery

## 2018-02-20 DIAGNOSIS — M1711 Unilateral primary osteoarthritis, right knee: Secondary | ICD-10-CM | POA: Diagnosis not present

## 2018-02-20 DIAGNOSIS — Z7982 Long term (current) use of aspirin: Secondary | ICD-10-CM | POA: Diagnosis not present

## 2018-02-20 DIAGNOSIS — R05 Cough: Secondary | ICD-10-CM | POA: Diagnosis not present

## 2018-02-20 DIAGNOSIS — H9312 Tinnitus, left ear: Secondary | ICD-10-CM | POA: Diagnosis not present

## 2018-02-20 DIAGNOSIS — E785 Hyperlipidemia, unspecified: Secondary | ICD-10-CM | POA: Diagnosis not present

## 2018-02-20 DIAGNOSIS — M109 Gout, unspecified: Secondary | ICD-10-CM | POA: Diagnosis not present

## 2018-02-20 LAB — BASIC METABOLIC PANEL
Anion gap: 9 (ref 5–15)
BUN: 11 mg/dL (ref 6–20)
CALCIUM: 8.7 mg/dL — AB (ref 8.9–10.3)
CO2: 25 mmol/L (ref 22–32)
CREATININE: 1.21 mg/dL (ref 0.61–1.24)
Chloride: 102 mmol/L (ref 101–111)
GFR calc non Af Amer: >60 mL/min (ref >60)
Glucose, Bld: 137 mg/dL — ABNORMAL HIGH (ref 65–99)
Potassium: 3.9 mmol/L (ref 3.5–5.1)
SODIUM: 136 mmol/L (ref 135–145)

## 2018-02-20 LAB — CBC
HCT: 36.7 % — ABNORMAL LOW (ref 39.0–52.0)
Hemoglobin: 12.2 g/dL — ABNORMAL LOW (ref 13.0–17.0)
MCH: 30.9 pg (ref 26.0–34.0)
MCHC: 33.2 g/dL (ref 30.0–36.0)
MCV: 92.9 fL (ref 78.0–100.0)
PLATELETS: 242 10*3/uL (ref 150–400)
RBC: 3.95 MIL/uL — ABNORMAL LOW (ref 4.22–5.81)
RDW: 13.4 % (ref 11.5–15.5)
WBC: 17.9 10*3/uL — ABNORMAL HIGH (ref 4.0–10.5)

## 2018-02-20 MED ORDER — ACETAMINOPHEN 325 MG PO TABS: 650.0000 mg | ORAL_TABLET | Freq: Four times a day (QID) | ORAL | Status: AC | PRN

## 2018-02-20 MED ORDER — DOCUSATE SODIUM 100 MG PO CAPS: ORAL_CAPSULE | ORAL | 0 refills | Status: DC

## 2018-02-20 MED ORDER — ASPIRIN 325 MG PO TBEC: DELAYED_RELEASE_TABLET | ORAL | 0 refills | Status: DC

## 2018-02-20 MED ORDER — POLYETHYLENE GLYCOL 3350 17 G PO PACK: PACK | ORAL | 0 refills | Status: DC

## 2018-02-20 MED ORDER — OXYCODONE HCL 5 MG PO TABS: ORAL_TABLET | ORAL | 0 refills | Status: DC

## 2018-02-20 NOTE — Plan of Care (Signed)
  Problem: Activity: Goal: Risk for activity intolerance will decrease Outcome: Progressing   Problem: Elimination: Goal: Will not experience complications related to bowel motility Outcome: Progressing   Problem: Pain Managment: Goal: General experience of comfort will improve Outcome: Progressing   Problem: Safety: Goal: Ability to remain free from injury will improve Outcome: Progressing   

## 2018-02-20 NOTE — Progress Notes (Signed)
Provided discharge education/instructions, all questions and concerns addressed, Pt not in distress, discharged home with belongings accompanied by wife. 

## 2018-02-20 NOTE — Care Plan (Signed)
Met with patient and wife at bedside. He will discharge today. He is set up with [facility] for HHPT and will transition to [facility] in [place] . His follow with Dr. Noemi Chapel is set and he has the appointment time. His equipment is at home.   Please call Ladell Heads, [place] with questions

## 2018-02-20 NOTE — Care Management Note (Signed)
Case Management Note  Patient Details  Name: [name] MRN: [id number] Date of Birth: [birth year]  Subjective/Objective:   67 yr old male s/p right total knee arthroplasty.                 Action/Plan: Patient was preoperatively setup with [facility], no changes. DME has been delivered to his home. Patient will have family support at discharge.    Expected Discharge Date:  02/20/18               Expected Discharge Plan:  [place]  In-House Referral:  NA  Discharge planning Services  CM Consult  Post Acute Care Choice:  Durable Medical Equipment Choice offered to:  Patient  DME Arranged:  3-N-1, Walker rolling DME Agency:  [employer]  HH Arranged:  PT [place]:  [place]  Status of Service:  Completed, signed off  If discussed at [place] of Stay Meetings, dates discussed:    Additional Comments:  Ninfa Meeker, RN 02/20/2018, 1:50 PM

## 2018-02-20 NOTE — Progress Notes (Signed)
MD   Patient asking for either Outpatient appt or referral for OSA testing.  Patient believes he has sleep apnea, was observed to desat overnight and 2-3L [place] applied.

## 2018-02-20 NOTE — Discharge Summary (Signed)
Patient ID: [name] MRN: [id number] DOB/AGE: 10-Jul-1951 67 y.o.  Admit date: 02/19/2018 Discharge date: 02/20/2018  Admission Diagnoses:  Active Problems:   Primary localized osteoarthritis of right knee   Discharge Diagnoses:  Same  Past Medical History:  Diagnosis Date  . Arthritis    "knees, fingers, elbows, toes" (02/19/2018)  . Dyslipidemia   . GERD (gastroesophageal reflux disease)   . Gout    "take RX daily" (02/19/2018)  . 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 Primary localized osteoarthrosis of the knee, right   . Tinnitus of left ear     Surgeries: Procedure(s): TOTAL KNEE ARTHROPLASTY on 02/19/2018   Consultants:   Discharged Condition: Improved  Hospital Course: Chadwick Reiswig is an 67 y.o. male who was admitted 02/19/2018 for operative treatment of<principal problem not specified>. Patient has severe unremitting pain that affects sleep, daily activities, and work/hobbies. After pre-op clearance the patient was taken to the operating room on 02/19/2018 and underwent  Procedure(s): TOTAL KNEE ARTHROPLASTY.    Patient was given perioperative antibiotics:  Anti-infectives (From admission, onward)   Start     Dose/Rate Route Frequency Ordered Stop   02/19/18 1300  ceFAZolin (ANCEF) IVPB 2g/100 mL premix     2 g 200 mL/hr over 30 Minutes Intravenous Every 6 hours 02/19/18 1254 02/20/18 0424   02/19/18 0707  ceFAZolin (ANCEF) IVPB 2g/100 mL premix     2 g 200 mL/hr over 30 Minutes Intravenous On call to O.R. 02/19/18 5916 02/19/18 0950       Patient was given sequential compression devices, early ambulation, and chemoprophylaxis to prevent DVT.  Patient benefited maximally from hospital stay and there were no complications.    Recent vital signs:  Patient Vitals for the past 24 hrs:  BP Temp Temp src Pulse Resp SpO2  02/20/18 0502 120/79 97.9 F (36.6 C) Oral 78 18 -  02/19/18 2350 (!) 147/90  98.4 F (36.9 C) Oral 83 18 97 %  02/19/18 2045 (!) 149/91 98.6 F (37 C) Oral 87 20 96 %  02/19/18 1506 (!) 148/92 98.7 F (37.1 C) Oral 82 - 94 %  02/19/18 1320 - (!) 97.4 F (36.3 C) - - - -  02/19/18 1300 124/70 - - 69 (!) 22 97 %  02/19/18 1245 118/73 - - 65 20 99 %  02/19/18 1230 116/77 - - 69 17 92 %  02/19/18 1220 113/71 - - 75 20 96 %  02/19/18 1205 111/64 - - 68 15 97 %  02/19/18 1150 (!) 95/51 - - 77 17 95 %  02/19/18 1135 114/67 97.8 F (36.6 C) - 92 15 95 %  02/19/18 0900 (!) 156/76 - - 87 19 99 %  02/19/18 0855 (!) 156/76 - - 87 15 99 %  02/19/18 0850 (!) 161/82 - - 74 12 98 %  02/19/18 0845 (!) 155/90 - - 75 16 99 %     Recent laboratory studies: No results for input(s): WBC, HGB, HCT, PLT, NA, K, CL, CO2, BUN, CREATININE, GLUCOSE, INR, CALCIUM in the last 72 hours.  Invalid input(s): PT, 2   Discharge Medications:   Allergies as of 02/20/2018      Reactions   Indomethacin    Disoriented      Medication List    STOP taking these medications   diclofenac 75 MG EC tablet Commonly known as:  VOLTAREN   ibuprofen 200 MG tablet Commonly known as:  ADVIL,MOTRIN   naproxen sodium 220 MG tablet Commonly known as:  ALEVE   Turmeric Curcumin 500 MG Caps     TAKE these medications   acetaminophen 325 MG tablet Commonly known as:  TYLENOL Take 2 tablets (650 mg total) by mouth every 6 (six) hours as needed for mild pain.   allopurinol 300 MG tablet Commonly known as:  ZYLOPRIM Take 1 tablet (300 mg total) daily by mouth. What changed:  when to take this   aspirin 325 MG EC tablet 1 tab a day for the next 30 days to prevent blood clots   desonide 0.05 % cream Commonly known as:  DESOWEN Apply 1 application topically 2 (two) times daily as needed.   docusate sodium 100 MG capsule Commonly known as:  COLACE 1 tab 2 times a day while on narcotics.  STOOL SOFTENER   oxyCODONE 5 MG immediate release tablet Commonly known as:  Oxy IR/ROXICODONE 1 tab  po q 4 hrs.   Patient had a total knee replacement on 02/20/2018.  He has been on this medication for the last 24 hrs in the hospital   pantoprazole 40 MG tablet Commonly known as:  PROTONIX TAKE 1 TABLET BY MOUTH  DAILY AFTER DINNER   polyethylene glycol packet Commonly known as:  MIRALAX / GLYCOLAX 17grams in 6 oz of water twice a day until bowel movement.  LAXITIVE.  Restart if two days since last bowel movement   simvastatin 20 MG tablet Commonly known as:  ZOCOR Take 1 tablet (20 mg total) at bedtime by mouth.            Discharge Care Instructions  (From admission, onward)        Start     Ordered   02/20/18 0000  Change dressing    Comments:  DO NOT REMOVE BANDAGE OVER SURGICAL INCISION.  [place] WHOLE LEG INCLUDING OVER THE WATERPROOF BANDAGE WITH SOAP AND WATER EVERY DAY.   02/20/18 0744      Diagnostic Studies: No results found.  Disposition: Discharge disposition: 01-Home or Self Care       Discharge Instructions    CPM   Complete by:  As directed    Continuous passive motion machine (CPM):      Use the CPM from 0 to 90 for 6 hours per day.       You may break it up into 2 or 3 sessions per day.      Use CPM for 2 weeks or until you are told to stop.   Call MD / Call 911   Complete by:  As directed    If you experience chest pain or shortness of breath, CALL 911 and be transported to the hospital emergency room.  If you develope a fever above 101 F, pus (white drainage) or increased drainage or redness at the wound, or calf pain, call your surgeon's office.   Change dressing   Complete by:  As directed    DO NOT REMOVE BANDAGE OVER SURGICAL INCISION.  [place] WHOLE LEG INCLUDING OVER THE WATERPROOF BANDAGE WITH SOAP AND WATER EVERY DAY.   Constipation Prevention   Complete by:  As directed    Drink plenty of fluids.  Prune juice may be helpful.  You may use a stool softener, such as Colace (over the counter) 100 mg twice a day.  Use MiraLax (over the  counter) for constipation as needed.   Diet - low sodium heart healthy   Complete by:  As directed    Discharge instructions   Complete by:  As directed    INSTRUCTIONS AFTER JOINT REPLACEMENT   Remove items at home which could result in a fall. This includes throw rugs or furniture in walking pathways ICE to the affected joint every three hours while awake for 30 minutes at a time, for at least the first 3-5 days, and then as needed for pain and swelling.  Continue to use ice for pain and swelling. You may notice swelling that will progress down to the foot and ankle.  This is normal after surgery.  Elevate your leg when you are not up walking on it.   Continue to use the breathing machine you got in the hospital (incentive spirometer) which will help keep your temperature down.  It is common for your temperature to cycle up and down following surgery, especially at night when you are not up moving around and exerting yourself.  The breathing machine keeps your lungs expanded and your temperature down.   DIET:  As you were doing prior to hospitalization, we recommend a well-balanced diet.  DRESSING / WOUND CARE / SHOWERING  Keep the surgical dressing until follow up.  The dressing is water proof, so you can shower without any extra covering.  IF THE DRESSING FALLS OFF or the wound gets wet inside, change the dressing with sterile gauze.  Please use good hand washing techniques before changing the dressing.  Do not use any lotions or creams on the incision until instructed by your surgeon.    ACTIVITY  Increase activity slowly as tolerated, but follow the weight bearing instructions below.   No driving for 6 weeks or until further direction given by your physician.  You cannot drive while taking narcotics.  No lifting or carrying greater than 10 lbs. until further directed by your surgeon. Avoid periods of inactivity such as sitting longer than an hour when not asleep. This helps prevent blood  clots.  You may return to work once you are authorized by your doctor.     WEIGHT BEARING   Weight bearing as tolerated with assist device (walker, cane, etc) as directed, use it as long as suggested by your surgeon or therapist, typically at least 2-3 weeks.   EXERCISES  Results after joint replacement surgery are often greatly improved when you follow the exercise, range of motion and muscle strengthening exercises prescribed by your doctor. Safety measures are also important to protect the joint from further injury. Any time any of these exercises cause you to have increased pain or swelling, decrease what you are doing until you are comfortable again and then slowly increase them. If you have problems or questions, call your caregiver or physical therapist for advice.   Rehabilitation is important following a joint replacement. After just a few days of immobilization, the muscles of the leg can become weakened and shrink (atrophy).  These exercises are designed to build up the tone and strength of the thigh and leg muscles and to improve motion. Often times heat used for twenty to thirty minutes before working out will loosen up your tissues and help with improving the range of motion but do not use heat for the first two weeks following surgery (sometimes heat can increase post-operative swelling).   These exercises can be done on a training (exercise) mat, on the floor, on a table or on a bed. Use whatever works the best and is most comfortable for you.    Use  music or television while you are exercising so that the exercises are a pleasant break in your day. This will make your life better with the exercises acting as a break in your routine that you can look forward to.   Perform all exercises about fifteen times, three times per day or as directed.  You should exercise both the operative leg and the other leg as well.   Exercises include:  Quad Sets - Tighten up the muscle on the front  of the thigh (Quad) and hold for 5-10 seconds.   Straight Leg Raises - With your knee straight (if you were given a brace, keep it on), lift the leg to 60 degrees, hold for 3 seconds, and slowly lower the leg.  Perform this exercise against resistance later as your leg gets stronger.  Leg Slides: Lying on your back, slowly slide your foot toward your buttocks, bending your knee up off the floor (only go as far as is comfortable). Then slowly slide your foot back down until your leg is flat on the floor again.  Angel Wings: Lying on your back spread your legs to the side as far apart as you can without causing discomfort.  Hamstring Strength:  Lying on your back, push your heel against the floor with your leg straight by tightening up the muscles of your buttocks.  Repeat, but this time bend your knee to a comfortable angle, and push your heel against the floor.  You may put a pillow under the heel to make it more comfortable if necessary.   A rehabilitation program following joint replacement surgery can speed recovery and prevent re-injury in the future due to weakened muscles. Contact your doctor or a physical therapist for more information on knee rehabilitation.    CONSTIPATION  Constipation is defined medically as fewer than three stools per week and severe constipation as less than one stool per week.  Even if you have a regular bowel pattern at home, your normal regimen is likely to be disrupted due to multiple reasons following surgery.  Combination of anesthesia, postoperative narcotics, change in appetite and fluid intake all can affect your bowels.   YOU MUST use at least one of the following options; they are listed in order of increasing strength to get the job done.  They are all available over the counter, and you may need to use some, POSSIBLY even all of these options:    Drink plenty of fluids (prune juice may be helpful) and high fiber foods Colace 100 mg by mouth twice a day   Senokot for constipation as directed and as needed Dulcolax (bisacodyl), take with full glass of water  Miralax (polyethylene glycol) once or twice a day as needed.  If you have tried all these things and are unable to have a bowel movement in the first 3-4 days after surgery call either your surgeon or your primary doctor.    If you experience loose stools or diarrhea, hold the medications until you stool forms back up.  If your symptoms do not get better within 1 week or if they get worse, check with your doctor.  If you experience "the worst abdominal pain ever" or develop nausea or vomiting, please contact the office immediately for further recommendations for treatment.   ITCHING:  If you experience itching with your medications, try taking only a single pain pill, or even half a pain pill at a time.  You can also use Benadryl over the counter for  itching or also to help with sleep.   TED HOSE STOCKINGS:  Use stockings on both legs until for at least 2 weeks or as directed by physician office. They may be removed at night for sleeping.  MEDICATIONS:  See your medication summary on the "After Visit Summary" that nursing will review with you.  You may have some home medications which will be placed on hold until you complete the course of blood thinner medication.  It is important for you to complete the blood thinner medication as prescribed.  PRECAUTIONS:  If you experience chest pain or shortness of breath - call 911 immediately for transfer to the hospital emergency department.   If you develop a fever greater that 101 F, purulent drainage from wound, increased redness or drainage from wound, foul odor from the wound/dressing, or calf pain - CONTACT YOUR SURGEON.                                                   FOLLOW-UP APPOINTMENTS:  If you do not already have a post-op appointment, please call the office for an appointment to be seen by your surgeon.  Guidelines for how soon to be seen  are listed in your "After Visit Summary", but are typically between 1-4 weeks after surgery.  OTHER INSTRUCTIONS:   Knee Replacement:  Do not place pillow under knee, focus on keeping the knee straight while resting. CPM instructions: 0-90 degrees, 2 hours in the morning, 2 hours in the afternoon, and 2 hours in the evening. Place foam block, curve side up under heel at all times except when in CPM or when walking.  DO NOT modify, tear, cut, or change the foam block in any way.  MAKE SURE YOU:  Understand these instructions.  Get help right away if you are not doing well or get worse.    Thank you for letting us be a part of your medical care team.  It is a privilege we respect greatly.  We hope these instructions will help you stay on track for a fast and full recovery!   Do not put a pillow under the knee. Place it under the heel.   Complete by:  As directed    Place gray foam block, curve side up under heel at all times except when in CPM or when walking.  DO NOT modify, tear, cut, or change in any way the gray foam block.   Increase activity slowly as tolerated   Complete by:  As directed    Patient may shower   Complete by:  As directed    Aquacel dressing is water proof    Wash over it and the whole leg with soap and water at the end of your shower   TED hose   Complete by:  As directed    Use stockings (TED hose) for 2 weeks on both leg(s).  You may remove them at night for sleeping.      Follow-up Information    Elsie Saas, MD Follow up on 03/06/2018.   Specialty:  Orthopedic Surgery Why:  appointment at 3:30 pm Contact information: [address] [place] [address] [phone]        [facility] Follow up.   Why:  Verl Dicker PT to do home health PT  Contact information: [address] [address] [phone]        [facility] Follow up on 03/06/2018.   Why:  appointment time 10:30 am            Signed: Linda Hedges 02/20/2018, 7:50 AM

## 2018-02-20 NOTE — Progress Notes (Signed)
Physical Therapy Treatment Patient Details Name: [name] MRN: [id number] DOB: November 03, 1950 Today's Date: 02/20/2018    History of Present Illness [name] is a 67 y/o male s/p elective Rt TKA. PMH includes gout.     PT Comments    Pt progressing well overall, reports to have AMB a full loop with PA this morning, and this session, performs stairs with RW at minguard to supervision. HEP reviewed, able to perform independently, good quads control. Pain well managed. Pt is ready for DC from PT perspective. Rt knee flexion 12-77 degrees.  Follow Up Recommendations  Follow surgeon's recommendation for DC plan and follow-up therapies;Home health PT;Supervision - Intermittent     Equipment Recommendations  None recommended by PT    Recommendations for Other Services       Precautions / Restrictions Precautions Precautions: Knee Precaution Booklet Issued: Yes (comment) Precaution Comments: Reviewed knee precautions and supine HEP.  Required Braces or Orthoses: Knee Immobilizer - Right Knee Immobilizer - Right: Other (comment);Discontinue once straight leg raise with < 10 degree lag Restrictions Weight Bearing Restrictions: Yes RLE Weight Bearing: Weight bearing as tolerated    Mobility  Bed Mobility Overal bed mobility: Modified Independent                Transfers Overall transfer level: Needs assistance Equipment used: Rolling walker (2 wheeled) Transfers: Sit to/from Stand Sit to Stand: Supervision            Ambulation/Gait Ambulation/Gait assistance: Supervision Ambulation Distance (Feet): 150 Feet Assistive device: Rolling walker (2 wheeled)   Gait velocity: 0.29m/s    General Gait Details: VC for gait corection, safe affective RW use    Stairs Stairs: Yes Stairs assistance: Min guard;Supervision Stair Management: No rails;With walker;Backwards Number of Stairs: 7     Wheelchair Mobility    Modified Rankin (Stroke Patients  Only)       Balance Overall balance assessment: Modified Independent;Mild deficits observed, not formally tested                                          Cognition Arousal/Alertness: Awake/alert Behavior During Therapy: WFL for tasks assessed/performed Overall Cognitive Status: Within Functional Limits for tasks assessed                                        Exercises Total Joint Exercises Short Arc Quad: AROM;Right;10 reps;Supine Heel Slides: AAROM;Right;10 reps;AROM(self assist with blanket) Straight Leg Raises: Right;AAROM;10 reps;AROM;Supine Goniometric ROM: Rt knee flexion 12-77 degrees    General Comments        Pertinent Vitals/Pain Pain Assessment: No/denies pain    Home Living                      Prior Function            PT Goals (current goals can now be found in the care plan section) Acute Rehab PT Goals Patient Stated Goal: to go home  PT Goal Formulation: With patient Time For Goal Achievement: 03/05/18 Potential to Achieve Goals: Good Progress towards PT goals: Progressing toward goals    Frequency    7X/week      PT Plan Current plan remains appropriate    Co-evaluation  AM-PAC PT "6 Clicks" Daily Activity  Outcome Measure  Difficulty turning over in bed (including adjusting bedclothes, sheets and blankets)?: A Little Difficulty moving from lying on back to sitting on the side of the bed? : A Little Difficulty sitting down on and standing up from a chair with arms (e.g., wheelchair, bedside commode, etc,.)?: A Little Help needed moving to and from a bed to chair (including a wheelchair)?: A Little Help needed walking in hospital room?: A Little Help needed climbing 3-5 steps with a railing? : A Little 6 Click Score: 18    End of Session Equipment Utilized During Treatment: Gait belt Activity Tolerance: Patient tolerated treatment well Patient left: in chair;with call  bell/phone within reach;with family/visitor present Nurse Communication: Mobility status PT Visit Diagnosis: Other abnormalities of gait and mobility (R26.89);Pain Pain - Right/Left: Right Pain - part of body: Knee     Time: 6967-8938 PT Time Calculation (min) (ACUTE ONLY): 42 min  Charges:  $Gait Training: 8-22 mins $Therapeutic Exercise: 8-22 mins $Therapeutic Activity: 8-22 mins                    G Codes:       12:49 PM, 03/09/18 Etta Grandchild, PT, DPT Physical Therapist - [place] [phone] (Pager)  [phone] (Office)      [name],[name] 03-09-18, 12:47 PM

## 2018-02-21 DIAGNOSIS — M1712 Unilateral primary osteoarthritis, left knee: Secondary | ICD-10-CM | POA: Diagnosis not present

## 2018-02-21 DIAGNOSIS — E785 Hyperlipidemia, unspecified: Secondary | ICD-10-CM | POA: Diagnosis not present

## 2018-02-21 DIAGNOSIS — H9312 Tinnitus, left ear: Secondary | ICD-10-CM | POA: Diagnosis not present

## 2018-02-21 DIAGNOSIS — C8590 Non-Hodgkin lymphoma, unspecified, unspecified site: Secondary | ICD-10-CM | POA: Diagnosis not present

## 2018-02-21 DIAGNOSIS — M109 Gout, unspecified: Secondary | ICD-10-CM | POA: Diagnosis not present

## 2018-02-21 DIAGNOSIS — Z471 Aftercare following joint replacement surgery: Secondary | ICD-10-CM | POA: Diagnosis not present

## 2018-02-21 DIAGNOSIS — Z96651 Presence of right artificial knee joint: Secondary | ICD-10-CM | POA: Diagnosis not present

## 2018-02-21 DIAGNOSIS — K219 Gastro-esophageal reflux disease without esophagitis: Secondary | ICD-10-CM | POA: Diagnosis not present

## 2018-02-21 DIAGNOSIS — Z79899 Other long term (current) drug therapy: Secondary | ICD-10-CM | POA: Diagnosis not present

## 2018-02-22 DIAGNOSIS — M109 Gout, unspecified: Secondary | ICD-10-CM | POA: Diagnosis not present

## 2018-02-22 DIAGNOSIS — Z471 Aftercare following joint replacement surgery: Secondary | ICD-10-CM | POA: Diagnosis not present

## 2018-02-22 DIAGNOSIS — Z96651 Presence of right artificial knee joint: Secondary | ICD-10-CM | POA: Diagnosis not present

## 2018-02-22 DIAGNOSIS — H9312 Tinnitus, left ear: Secondary | ICD-10-CM | POA: Diagnosis not present

## 2018-02-22 DIAGNOSIS — C8590 Non-Hodgkin lymphoma, unspecified, unspecified site: Secondary | ICD-10-CM | POA: Diagnosis not present

## 2018-02-22 DIAGNOSIS — M1712 Unilateral primary osteoarthritis, left knee: Secondary | ICD-10-CM | POA: Diagnosis not present

## 2018-02-26 DIAGNOSIS — M109 Gout, unspecified: Secondary | ICD-10-CM | POA: Diagnosis not present

## 2018-02-26 DIAGNOSIS — M1712 Unilateral primary osteoarthritis, left knee: Secondary | ICD-10-CM | POA: Diagnosis not present

## 2018-02-26 DIAGNOSIS — Z471 Aftercare following joint replacement surgery: Secondary | ICD-10-CM | POA: Diagnosis not present

## 2018-02-26 DIAGNOSIS — Z96651 Presence of right artificial knee joint: Secondary | ICD-10-CM | POA: Diagnosis not present

## 2018-02-26 DIAGNOSIS — C8590 Non-Hodgkin lymphoma, unspecified, unspecified site: Secondary | ICD-10-CM | POA: Diagnosis not present

## 2018-02-26 DIAGNOSIS — H9312 Tinnitus, left ear: Secondary | ICD-10-CM | POA: Diagnosis not present

## 2018-02-28 DIAGNOSIS — M109 Gout, unspecified: Secondary | ICD-10-CM | POA: Diagnosis not present

## 2018-02-28 DIAGNOSIS — H9312 Tinnitus, left ear: Secondary | ICD-10-CM | POA: Diagnosis not present

## 2018-02-28 DIAGNOSIS — C8590 Non-Hodgkin lymphoma, unspecified, unspecified site: Secondary | ICD-10-CM | POA: Diagnosis not present

## 2018-02-28 DIAGNOSIS — Z96651 Presence of right artificial knee joint: Secondary | ICD-10-CM | POA: Diagnosis not present

## 2018-02-28 DIAGNOSIS — Z471 Aftercare following joint replacement surgery: Secondary | ICD-10-CM | POA: Diagnosis not present

## 2018-02-28 DIAGNOSIS — M1712 Unilateral primary osteoarthritis, left knee: Secondary | ICD-10-CM | POA: Diagnosis not present

## 2018-03-02 DIAGNOSIS — M1712 Unilateral primary osteoarthritis, left knee: Secondary | ICD-10-CM | POA: Diagnosis not present

## 2018-03-02 DIAGNOSIS — M109 Gout, unspecified: Secondary | ICD-10-CM | POA: Diagnosis not present

## 2018-03-02 DIAGNOSIS — Z471 Aftercare following joint replacement surgery: Secondary | ICD-10-CM | POA: Diagnosis not present

## 2018-03-02 DIAGNOSIS — H9312 Tinnitus, left ear: Secondary | ICD-10-CM | POA: Diagnosis not present

## 2018-03-02 DIAGNOSIS — Z96651 Presence of right artificial knee joint: Secondary | ICD-10-CM | POA: Diagnosis not present

## 2018-03-02 DIAGNOSIS — C8590 Non-Hodgkin lymphoma, unspecified, unspecified site: Secondary | ICD-10-CM | POA: Diagnosis not present

## 2018-03-06 DIAGNOSIS — M1711 Unilateral primary osteoarthritis, right knee: Secondary | ICD-10-CM | POA: Diagnosis not present

## 2018-03-06 DIAGNOSIS — Z471 Aftercare following joint replacement surgery: Secondary | ICD-10-CM | POA: Diagnosis not present

## 2018-03-06 DIAGNOSIS — M6281 Muscle weakness (generalized): Secondary | ICD-10-CM | POA: Diagnosis not present

## 2018-03-06 DIAGNOSIS — Z96651 Presence of right artificial knee joint: Secondary | ICD-10-CM | POA: Diagnosis not present

## 2018-03-06 DIAGNOSIS — R2689 Other abnormalities of gait and mobility: Secondary | ICD-10-CM | POA: Diagnosis not present

## 2018-03-06 DIAGNOSIS — M25661 Stiffness of right knee, not elsewhere classified: Secondary | ICD-10-CM | POA: Diagnosis not present

## 2018-03-08 DIAGNOSIS — Z471 Aftercare following joint replacement surgery: Secondary | ICD-10-CM | POA: Diagnosis not present

## 2018-03-08 DIAGNOSIS — Z96651 Presence of right artificial knee joint: Secondary | ICD-10-CM | POA: Diagnosis not present

## 2018-03-08 DIAGNOSIS — M1711 Unilateral primary osteoarthritis, right knee: Secondary | ICD-10-CM | POA: Diagnosis not present

## 2018-03-08 DIAGNOSIS — M6281 Muscle weakness (generalized): Secondary | ICD-10-CM | POA: Diagnosis not present

## 2018-03-08 DIAGNOSIS — R2689 Other abnormalities of gait and mobility: Secondary | ICD-10-CM | POA: Diagnosis not present

## 2018-03-08 DIAGNOSIS — M25661 Stiffness of right knee, not elsewhere classified: Secondary | ICD-10-CM | POA: Diagnosis not present

## 2018-03-12 DIAGNOSIS — Z471 Aftercare following joint replacement surgery: Secondary | ICD-10-CM | POA: Diagnosis not present

## 2018-03-12 DIAGNOSIS — R2689 Other abnormalities of gait and mobility: Secondary | ICD-10-CM | POA: Diagnosis not present

## 2018-03-12 DIAGNOSIS — M6281 Muscle weakness (generalized): Secondary | ICD-10-CM | POA: Diagnosis not present

## 2018-03-12 DIAGNOSIS — M25661 Stiffness of right knee, not elsewhere classified: Secondary | ICD-10-CM | POA: Diagnosis not present

## 2018-03-12 DIAGNOSIS — Z96651 Presence of right artificial knee joint: Secondary | ICD-10-CM | POA: Diagnosis not present

## 2018-03-14 DIAGNOSIS — Z96651 Presence of right artificial knee joint: Secondary | ICD-10-CM | POA: Diagnosis not present

## 2018-03-14 DIAGNOSIS — M6281 Muscle weakness (generalized): Secondary | ICD-10-CM | POA: Diagnosis not present

## 2018-03-14 DIAGNOSIS — R2689 Other abnormalities of gait and mobility: Secondary | ICD-10-CM | POA: Diagnosis not present

## 2018-03-14 DIAGNOSIS — Z471 Aftercare following joint replacement surgery: Secondary | ICD-10-CM | POA: Diagnosis not present

## 2018-03-14 DIAGNOSIS — M25661 Stiffness of right knee, not elsewhere classified: Secondary | ICD-10-CM | POA: Diagnosis not present

## 2018-03-16 DIAGNOSIS — M25661 Stiffness of right knee, not elsewhere classified: Secondary | ICD-10-CM | POA: Diagnosis not present

## 2018-03-16 DIAGNOSIS — Z471 Aftercare following joint replacement surgery: Secondary | ICD-10-CM | POA: Diagnosis not present

## 2018-03-16 DIAGNOSIS — Z96651 Presence of right artificial knee joint: Secondary | ICD-10-CM | POA: Diagnosis not present

## 2018-03-16 DIAGNOSIS — M6281 Muscle weakness (generalized): Secondary | ICD-10-CM | POA: Diagnosis not present

## 2018-03-16 DIAGNOSIS — R2689 Other abnormalities of gait and mobility: Secondary | ICD-10-CM | POA: Diagnosis not present

## 2018-03-19 DIAGNOSIS — M25661 Stiffness of right knee, not elsewhere classified: Secondary | ICD-10-CM | POA: Diagnosis not present

## 2018-03-19 DIAGNOSIS — Z471 Aftercare following joint replacement surgery: Secondary | ICD-10-CM | POA: Diagnosis not present

## 2018-03-19 DIAGNOSIS — Z96651 Presence of right artificial knee joint: Secondary | ICD-10-CM | POA: Diagnosis not present

## 2018-03-19 DIAGNOSIS — M6281 Muscle weakness (generalized): Secondary | ICD-10-CM | POA: Diagnosis not present

## 2018-03-19 DIAGNOSIS — R2689 Other abnormalities of gait and mobility: Secondary | ICD-10-CM | POA: Diagnosis not present

## 2018-03-21 DIAGNOSIS — Z96651 Presence of right artificial knee joint: Secondary | ICD-10-CM | POA: Diagnosis not present

## 2018-03-21 DIAGNOSIS — Z471 Aftercare following joint replacement surgery: Secondary | ICD-10-CM | POA: Diagnosis not present

## 2018-03-21 DIAGNOSIS — M6281 Muscle weakness (generalized): Secondary | ICD-10-CM | POA: Diagnosis not present

## 2018-03-21 DIAGNOSIS — M25661 Stiffness of right knee, not elsewhere classified: Secondary | ICD-10-CM | POA: Diagnosis not present

## 2018-03-21 DIAGNOSIS — R2689 Other abnormalities of gait and mobility: Secondary | ICD-10-CM | POA: Diagnosis not present

## 2018-03-23 DIAGNOSIS — Z471 Aftercare following joint replacement surgery: Secondary | ICD-10-CM | POA: Diagnosis not present

## 2018-03-23 DIAGNOSIS — Z96651 Presence of right artificial knee joint: Secondary | ICD-10-CM | POA: Diagnosis not present

## 2018-03-23 DIAGNOSIS — M6281 Muscle weakness (generalized): Secondary | ICD-10-CM | POA: Diagnosis not present

## 2018-03-23 DIAGNOSIS — R2689 Other abnormalities of gait and mobility: Secondary | ICD-10-CM | POA: Diagnosis not present

## 2018-03-23 DIAGNOSIS — M25661 Stiffness of right knee, not elsewhere classified: Secondary | ICD-10-CM | POA: Diagnosis not present

## 2018-03-28 DIAGNOSIS — Z471 Aftercare following joint replacement surgery: Secondary | ICD-10-CM | POA: Diagnosis not present

## 2018-03-28 DIAGNOSIS — R2689 Other abnormalities of gait and mobility: Secondary | ICD-10-CM | POA: Diagnosis not present

## 2018-03-28 DIAGNOSIS — M6281 Muscle weakness (generalized): Secondary | ICD-10-CM | POA: Diagnosis not present

## 2018-03-28 DIAGNOSIS — M25661 Stiffness of right knee, not elsewhere classified: Secondary | ICD-10-CM | POA: Diagnosis not present

## 2018-03-28 DIAGNOSIS — Z96651 Presence of right artificial knee joint: Secondary | ICD-10-CM | POA: Diagnosis not present

## 2018-03-30 DIAGNOSIS — R2689 Other abnormalities of gait and mobility: Secondary | ICD-10-CM | POA: Diagnosis not present

## 2018-03-30 DIAGNOSIS — M25661 Stiffness of right knee, not elsewhere classified: Secondary | ICD-10-CM | POA: Diagnosis not present

## 2018-03-30 DIAGNOSIS — Z96651 Presence of right artificial knee joint: Secondary | ICD-10-CM | POA: Diagnosis not present

## 2018-03-30 DIAGNOSIS — M6281 Muscle weakness (generalized): Secondary | ICD-10-CM | POA: Diagnosis not present

## 2018-03-30 DIAGNOSIS — Z471 Aftercare following joint replacement surgery: Secondary | ICD-10-CM | POA: Diagnosis not present

## 2018-04-03 DIAGNOSIS — M1711 Unilateral primary osteoarthritis, right knee: Secondary | ICD-10-CM | POA: Diagnosis not present

## 2018-04-11 DIAGNOSIS — Z96651 Presence of right artificial knee joint: Secondary | ICD-10-CM | POA: Diagnosis not present

## 2018-04-11 DIAGNOSIS — R2689 Other abnormalities of gait and mobility: Secondary | ICD-10-CM | POA: Diagnosis not present

## 2018-04-11 DIAGNOSIS — M6281 Muscle weakness (generalized): Secondary | ICD-10-CM | POA: Diagnosis not present

## 2018-04-11 DIAGNOSIS — M25661 Stiffness of right knee, not elsewhere classified: Secondary | ICD-10-CM | POA: Diagnosis not present

## 2018-04-11 DIAGNOSIS — Z471 Aftercare following joint replacement surgery: Secondary | ICD-10-CM | POA: Diagnosis not present

## 2018-04-18 DIAGNOSIS — M25661 Stiffness of right knee, not elsewhere classified: Secondary | ICD-10-CM | POA: Diagnosis not present

## 2018-04-18 DIAGNOSIS — Z96651 Presence of right artificial knee joint: Secondary | ICD-10-CM | POA: Diagnosis not present

## 2018-04-18 DIAGNOSIS — R2689 Other abnormalities of gait and mobility: Secondary | ICD-10-CM | POA: Diagnosis not present

## 2018-04-18 DIAGNOSIS — M6281 Muscle weakness (generalized): Secondary | ICD-10-CM | POA: Diagnosis not present

## 2018-04-18 DIAGNOSIS — Z471 Aftercare following joint replacement surgery: Secondary | ICD-10-CM | POA: Diagnosis not present

## 2018-04-25 DIAGNOSIS — M25661 Stiffness of right knee, not elsewhere classified: Secondary | ICD-10-CM | POA: Diagnosis not present

## 2018-04-25 DIAGNOSIS — Z96651 Presence of right artificial knee joint: Secondary | ICD-10-CM | POA: Diagnosis not present

## 2018-04-25 DIAGNOSIS — R2689 Other abnormalities of gait and mobility: Secondary | ICD-10-CM | POA: Diagnosis not present

## 2018-04-25 DIAGNOSIS — M6281 Muscle weakness (generalized): Secondary | ICD-10-CM | POA: Diagnosis not present

## 2018-04-25 DIAGNOSIS — Z471 Aftercare following joint replacement surgery: Secondary | ICD-10-CM | POA: Diagnosis not present

## 2018-05-14 ENCOUNTER — Ambulatory Visit: Payer: Medicare Other | Admitting: Family Medicine

## 2018-05-14 DIAGNOSIS — Z6827 Body mass index (BMI) 27.0-27.9, adult: Secondary | ICD-10-CM | POA: Diagnosis not present

## 2018-05-14 DIAGNOSIS — N400 Enlarged prostate without lower urinary tract symptoms: Secondary | ICD-10-CM | POA: Diagnosis not present

## 2018-05-14 DIAGNOSIS — Z8572 Personal history of non-Hodgkin lymphomas: Secondary | ICD-10-CM | POA: Diagnosis not present

## 2018-05-14 DIAGNOSIS — E7800 Pure hypercholesterolemia, unspecified: Secondary | ICD-10-CM | POA: Diagnosis not present

## 2018-05-14 DIAGNOSIS — N486 Induration penis plastica: Secondary | ICD-10-CM | POA: Diagnosis not present

## 2018-05-14 DIAGNOSIS — M109 Gout, unspecified: Secondary | ICD-10-CM | POA: Diagnosis not present

## 2018-05-15 DIAGNOSIS — M1711 Unilateral primary osteoarthritis, right knee: Secondary | ICD-10-CM | POA: Diagnosis not present

## 2018-06-25 ENCOUNTER — Ambulatory Visit: Payer: Medicare Other | Admitting: Family Medicine

## 2018-07-24 DIAGNOSIS — D1801 Hemangioma of skin and subcutaneous tissue: Secondary | ICD-10-CM | POA: Diagnosis not present

## 2018-07-24 DIAGNOSIS — L570 Actinic keratosis: Secondary | ICD-10-CM | POA: Diagnosis not present

## 2018-07-24 DIAGNOSIS — L821 Other seborrheic keratosis: Secondary | ICD-10-CM | POA: Diagnosis not present

## 2018-08-14 DIAGNOSIS — M1711 Unilateral primary osteoarthritis, right knee: Secondary | ICD-10-CM | POA: Diagnosis not present

## 2018-09-03 ENCOUNTER — Encounter: Payer: Self-pay | Admitting: Family Medicine

## 2018-09-03 ENCOUNTER — Ambulatory Visit (INDEPENDENT_AMBULATORY_CARE_PROVIDER_SITE_OTHER): Payer: Medicare Other | Admitting: Family Medicine

## 2018-09-03 VITALS — BP 132/74 | HR 82 | Temp 97.0°F | Ht 70.0 in | Wt 192.0 lb

## 2018-09-03 DIAGNOSIS — Z0000 Encounter for general adult medical examination without abnormal findings: Secondary | ICD-10-CM | POA: Diagnosis not present

## 2018-09-03 DIAGNOSIS — M19042 Primary osteoarthritis, left hand: Secondary | ICD-10-CM | POA: Diagnosis not present

## 2018-09-03 DIAGNOSIS — L853 Xerosis cutis: Secondary | ICD-10-CM | POA: Diagnosis not present

## 2018-09-03 DIAGNOSIS — M19041 Primary osteoarthritis, right hand: Secondary | ICD-10-CM | POA: Diagnosis not present

## 2018-09-03 DIAGNOSIS — Z1211 Encounter for screening for malignant neoplasm of colon: Secondary | ICD-10-CM | POA: Diagnosis not present

## 2018-09-03 DIAGNOSIS — Z23 Encounter for immunization: Secondary | ICD-10-CM | POA: Diagnosis not present

## 2018-09-03 DIAGNOSIS — M1000 Idiopathic gout, unspecified site: Secondary | ICD-10-CM

## 2018-09-03 DIAGNOSIS — K219 Gastro-esophageal reflux disease without esophagitis: Secondary | ICD-10-CM | POA: Diagnosis not present

## 2018-09-03 DIAGNOSIS — M170 Bilateral primary osteoarthritis of knee: Secondary | ICD-10-CM | POA: Diagnosis not present

## 2018-09-03 DIAGNOSIS — Z6827 Body mass index (BMI) 27.0-27.9, adult: Secondary | ICD-10-CM

## 2018-09-03 DIAGNOSIS — E782 Mixed hyperlipidemia: Secondary | ICD-10-CM

## 2018-09-03 MED ORDER — PANTOPRAZOLE SODIUM 40 MG PO TBEC: DELAYED_RELEASE_TABLET | ORAL | 3 refills | Status: DC

## 2018-09-03 MED ORDER — ALLOPURINOL 300 MG PO TABS: 300.0000 mg | ORAL_TABLET | Freq: Every evening | ORAL | 3 refills | Status: DC

## 2018-09-03 MED ORDER — DICLOFENAC SODIUM 75 MG PO TBEC: 75.0000 mg | DELAYED_RELEASE_TABLET | Freq: Two times a day (BID) | ORAL | 3 refills | Status: DC

## 2018-09-03 NOTE — Progress Notes (Addendum)
Subjective:    Patient ID: [name], male    DOB: 10/20/1951, 67 y.o.   MRN: [id number]  Chief Complaint:  Medical Management of Chronic Issues   HPI: [name] is a 67 y.o. male presenting on 09/03/2018 for Medical Management of Chronic Issues   1. Annual physical exam  States doing well overall. Has recovered well from recent knee replacement. Remains active. Tolerating medications well. Denies chest pain, palpitations, headaches, blurred vision, abdominal pain, or urinary symptoms. Does complain of dry skin to bilateral legs.    2. Mixed hyperlipidemia  Compliant with medications, no adverse side effects. Does not exercise on a regular basis. Tries to watch diet.   3. Idiopathic gout, unspecified chronicity, unspecified site  Taking allopurinol as a preventative. Tolerating medication well. Has not had a flare up in months.   4. GERD without esophagitis  Taking protonix prescribed and is tolerating well. Denies breakthrough symptoms. Does avoid spicy foods.    5. Screening for malignant neoplasm of colon  Last colonoscopy at age 69. Denies blood in stool, changes in bowel movements. Denies abdominal or rectal pain. Denies unexplained weight loss.    6. BMI 27.0-27.9,adult  Does not exercise on a regular basis. Does try to watch diet.    7. Arthritis of both knees  Recent right knee replacement with full recovery. Denies decreased ROM. States his pain is minimal in knees and has much improved since the surgery.    8. Arthritis of both hands  Decreased ROM and grip strength in bilateral hands. Taking Voltaren on a daily basis with good control of pain and inflammation.      Relevant past medical, surgical, family, and social history reviewed and updated as indicated.  Allergies and medications reviewed and updated.   Past Medical History:  Diagnosis Date  . Arthritis    "knees, fingers, elbows, toes" (02/19/2018)  . Dyslipidemia   . GERD  (gastroesophageal reflux disease)   . Gout    "take RX daily" (02/19/2018)  . Non Hodgkin's lymphoma, stage IIA ([place]) 1998   non hodgkins lymphoma stage II  . Primary localized osteoarthrosis of the knee, right   . Tinnitus of left ear     Past Surgical History:  Procedure Laterality Date  . COLONOSCOPY    . JOINT REPLACEMENT    . KNEE ARTHROSCOPY Right 10/31/06  . MOUTH SURGERY     "related to replacing crown; reduced the pedistal"  . TOE SURGERY Bilateral    Great toe each foot from athletic injuries; "ground the top of the bone off"  . 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 TOTAL KNEE ARTHROPLASTY Right 02/19/2018   Procedure: TOTAL KNEE ARTHROPLASTY;  Surgeon: Elsie Saas, MD;  Location: [facility];  Service: Orthopedics;  Laterality: Right;    Social History   Socioeconomic History  . Marital status: Married    Spouse name: Carmela  . Number of children: Not on file  . Years of education: 64  . Highest education level: Bachelor's degree (e.g., BA, AB, BS)  Occupational History  . Occupation: retired  Scientific laboratory technician  . Financial resource strain: Not on file  . Food insecurity:    Worry: Not on file    Inability: Not on file  . Transportation needs:    Medical: Not on file    Non-medical: Not on file  Tobacco Use  . Smoking status: Never Smoker  . Smokeless tobacco: Never Used  Substance and Sexual Activity  .  Alcohol use: Yes    Alcohol/week: 3.0 standard drinks    Types: 3 Cans of beer per week  . Drug use: No  . Sexual activity: Not Currently  Lifestyle  . Physical activity:    Days per week: Not on file    Minutes per session: Not on file  . Stress: Not on file  Relationships  . Social connections:    Talks on phone: Not on file    Gets together: Not on file    Attends religious service: Not on file    Active member of club or organization: Not on file    Attends meetings of clubs or organizations: Not on file    Relationship status: Not on file  .  Intimate partner violence:    Fear of current or ex partner: Not on file    Emotionally abused: Not on file    Physically abused: Not on file    Forced sexual activity: Not on file  Other Topics Concern  . Not on file  Social History Narrative  . Not on file    Outpatient Encounter Medications as of 09/03/2018  Medication Sig  . acetaminophen (TYLENOL) 325 MG tablet Take 2 tablets (650 mg total) by mouth every 6 (six) hours as needed for mild pain.  Marland Kitchen allopurinol (ZYLOPRIM) 300 MG tablet Take 1 tablet (300 mg total) by mouth every evening.  Marland Kitchen aspirin EC 325 MG EC tablet 1 tab a day for the next 30 days to prevent blood clots  . desonide (DESOWEN) 0.05 % cream Apply 1 application topically 2 (two) times daily as needed.  . diclofenac (VOLTAREN) 75 MG EC tablet Take 1 tablet (75 mg total) by mouth 2 (two) times daily.  . pantoprazole (PROTONIX) 40 MG tablet TAKE 1 TABLET BY MOUTH  DAILY AFTER DINNER  . simvastatin (ZOCOR) 20 MG tablet Take 1 tablet (20 mg total) at bedtime by mouth.  . [DISCONTINUED] allopurinol (ZYLOPRIM) 300 MG tablet Take 1 tablet (300 mg total) daily by mouth. (Patient taking differently: Take 300 mg by mouth every evening. )  . [DISCONTINUED] diclofenac (VOLTAREN) 75 MG EC tablet TK 1 T PO BID WF P OR SWELLING  . [DISCONTINUED] pantoprazole (PROTONIX) 40 MG tablet TAKE 1 TABLET BY MOUTH  DAILY AFTER DINNER  . [DISCONTINUED] docusate sodium (COLACE) 100 MG capsule 1 tab 2 times a day while on narcotics.  STOOL SOFTENER  . [DISCONTINUED] oxyCODONE (OXY IR/ROXICODONE) 5 MG immediate release tablet 1 tab po q 4 hrs.   Patient had a total knee replacement on 02/20/2018.  He has been on this medication for the last 24 hrs in the hospital  . [DISCONTINUED] polyethylene glycol (MIRALAX / GLYCOLAX) packet 17grams in 6 oz of water twice a day until bowel movement.  LAXITIVE.  Restart if two days since last bowel movement   No facility-administered encounter medications on file as  of 09/03/2018.     Allergies  Allergen Reactions  . Indomethacin     Disoriented    Review of Systems  Constitutional: Negative for activity change, appetite change, chills, fatigue and fever.  HENT: Positive for congestion (intermittent) and sneezing.   Eyes: Negative for photophobia and visual disturbance.  Respiratory: Positive for cough. Negative for chest tightness and shortness of breath.   Cardiovascular: Negative for chest pain, palpitations and leg swelling.  Gastrointestinal: Negative for abdominal pain, blood in stool, constipation, diarrhea, nausea, rectal pain and vomiting.  Genitourinary: Negative for decreased urine volume, difficulty urinating,  dysuria, enuresis, flank pain, frequency, penile pain, penile swelling, scrotal swelling, testicular pain and urgency.  Musculoskeletal: Positive for arthralgias (bilateral hands and bilateral knees) and joint swelling (bilateral hands, right knee). Negative for myalgias, neck pain and neck stiffness.  Skin: Negative for color change.       dryness  Neurological: Negative for dizziness, weakness, light-headedness, numbness and headaches.  Hematological: Negative for adenopathy.  Psychiatric/Behavioral: Negative for behavioral problems, confusion, sleep disturbance and suicidal ideas.  All other systems reviewed and are negative.       Objective:    BP 132/74   Pulse 82   Temp (!) 97 F (36.1 C) (Oral)   Ht '5\' 10"'$  (1.778 m)   Wt 192 lb (87.1 kg)   BMI 27.55 kg/m    Wt Readings from Last 3 Encounters:  09/03/18 192 lb (87.1 kg)  02/19/18 197 lb 5 oz (89.5 kg)  01/31/18 197 lb 5 oz (89.5 kg)    Physical Exam  Constitutional: He is oriented to person, place, and time. He appears well-developed and well-nourished. He is cooperative. No distress.  HENT:  Head: Normocephalic and atraumatic.  Right Ear: Hearing, tympanic membrane, external ear and ear canal normal.  Left Ear: Hearing, tympanic membrane, external ear and  ear canal normal.  Nose: Nose normal.  Mouth/Throat: Uvula is midline, oropharynx is clear and moist and mucous membranes are normal.  Eyes: Pupils are equal, round, and reactive to light. Conjunctivae, EOM and lids are normal.  Neck: Trachea normal, full passive range of motion without pain and phonation normal. Neck supple. No JVD present. Carotid bruit is not present. No thyroid mass and no thyromegaly present.  Cardiovascular: Normal rate, regular rhythm, normal heart sounds and intact distal pulses. PMI is not displaced. Exam reveals no gallop and no friction rub.  Pulmonary/Chest: Effort normal and breath sounds normal. No respiratory distress.  Abdominal: Soft. Normal appearance and bowel sounds are normal. There is no tenderness.  Musculoskeletal:       Right knee: He exhibits decreased range of motion (limited flexion due to recent knee replacement. ). He exhibits no swelling, no effusion, no ecchymosis, no deformity, no laceration, no erythema and normal alignment. No tenderness found.       Right hand: He exhibits decreased range of motion and swelling.       Left hand: He exhibits decreased range of motion and swelling.  Lymphadenopathy:    He has no cervical adenopathy.  Neurological: He is alert and oriented to person, place, and time. He has normal strength and normal reflexes. No cranial nerve deficit or sensory deficit.  Skin: Skin is warm, dry and intact. Capillary refill takes less than 2 seconds.  Xerosis to bilateral lower extremities   Psychiatric: He has a normal mood and affect. His speech is normal and behavior is normal. Judgment and thought content normal. Cognition and memory are normal.  Nursing note and vitals reviewed.   Results for orders placed or performed during the hospital encounter of 02/19/18  CBC  Result Value Ref Range   WBC 17.9 (H) 4.0 - 10.5 K/uL   RBC 3.95 (L) 4.22 - 5.81 MIL/uL   Hemoglobin 12.2 (L) 13.0 - 17.0 g/dL   HCT 36.7 (L) 39.0 - 52.0 %    MCV 92.9 78.0 - 100.0 fL   MCH 30.9 26.0 - 34.0 pg   MCHC 33.2 30.0 - 36.0 g/dL   RDW 13.4 11.5 - 15.5 %   Platelets 242 150 - 400  K/uL  Basic metabolic panel  Result Value Ref Range   Sodium 136 135 - 145 mmol/L   Potassium 3.9 3.5 - 5.1 mmol/L   Chloride 102 101 - 111 mmol/L   CO2 25 22 - 32 mmol/L   Glucose, Bld 137 (H) 65 - 99 mg/dL   BUN 11 6 - 20 mg/dL   Creatinine, Ser 1.21 0.61 - 1.24 mg/dL   Calcium 8.7 (L) 8.9 - 10.3 mg/dL   GFR calc non Af Amer >60 >60 mL/min   GFR calc Af Amer >60 >60 mL/min   Anion gap 9 5 - 15       Pertinent labs & imaging results that were available during my care of the patient were reviewed by me and considered in my medical decision making.  Assessment & Plan:  Breckon was seen today for medical management of chronic issues.  Diagnoses and all orders for this visit:  Annual physical exam Health Maintenance discussed. Diet and exercise encouraged.  -     CBC with Differential/Platelet -     CMP14+EGFR -     Lipid panel -     TSH -     Uric acid -     Ambulatory referral to Gastroenterology -     Hepatitis C antibody  Mixed hyperlipidemia Diet and exercise encouraged. Continue medications as prescribed.  -     CBC with Differential/Platelet -     CMP14+EGFR -     Lipid panel -     TSH  Idiopathic gout, unspecified chronicity, unspecified site Diet discussed. Continue preventative medications as prescribed. -     allopurinol (ZYLOPRIM) 300 MG tablet; Take 1 tablet (300 mg total) by mouth every evening. -     diclofenac (VOLTAREN) 75 MG EC tablet; Take 1 tablet (75 mg total) by mouth 2 (two) times daily. -     Uric acid  GERD without esophagitis Preventative measures discussed. Avoid spicy, greasy foods. Avoid caffeine, alcohol, and tobacco. Avoid eating for at least 45 minutes prior to bed.  -     pantoprazole (PROTONIX) 40 MG tablet; TAKE 1 TABLET BY MOUTH  DAILY AFTER DINNER  Screening for malignant neoplasm of  colon Referral to GI for colonoscopy. -     Ambulatory referral to Gastroenterology  BMI 27.0-27.9,adult Diet and exercise encouraged.   Arthritis of both knees -     diclofenac (VOLTAREN) 75 MG EC tablet; Take 1 tablet (75 mg total) by mouth 2 (two) times daily.  Arthritis of both hands -     diclofenac (VOLTAREN) 75 MG EC tablet; Take 1 tablet (75 mg total) by mouth 2 (two) times daily.  Xerosis of skin Emollients over the counter such as CeraVe.     Continue all other maintenance medications.  Follow up plan: Return in about 6 months (around 03/04/2019), or if symptoms worsen or fail to improve.  Educational handout given for health maintenance.   The above assessment and management plan was discussed with the patient. The patient verbalized understanding of and has agreed to the management plan. Patient is aware to call the clinic if symptoms persist or worsen. Patient is aware when to return to the clinic for a follow-up visit. Patient educated on when it is appropriate to go to the emergency department.   Monia Pouch, FNP-C [facility] [phone]

## 2018-09-03 NOTE — Patient Instructions (Signed)

## 2018-09-04 ENCOUNTER — Encounter: Payer: Self-pay | Admitting: Gastroenterology

## 2018-09-04 LAB — URIC ACID: URIC ACID: 5.1 mg/dL (ref 3.7–8.6)

## 2018-09-04 LAB — CBC WITH DIFFERENTIAL/PLATELET
BASOS: 1 %
Basophils Absolute: 0.1 10*3/uL (ref 0.0–0.2)
EOS (ABSOLUTE): 0.4 10*3/uL (ref 0.0–0.4)
EOS: 6 %
HEMATOCRIT: 44.6 % (ref 37.5–51.0)
Hemoglobin: 15.3 g/dL (ref 13.0–17.7)
Immature Grans (Abs): 0 10*3/uL (ref 0.0–0.1)
Immature Granulocytes: 0 %
LYMPHS ABS: 1.2 10*3/uL (ref 0.7–3.1)
Lymphs: 19 %
MCH: 30.8 pg (ref 26.6–33.0)
MCHC: 34.3 g/dL (ref 31.5–35.7)
MCV: 90 fL (ref 79–97)
MONOS ABS: 0.7 10*3/uL (ref 0.1–0.9)
Monocytes: 11 %
Neutrophils Absolute: 3.8 10*3/uL (ref 1.4–7.0)
Neutrophils: 63 %
Platelets: 241 10*3/uL (ref 150–450)
RBC: 4.96 x10E6/uL (ref 4.14–5.80)
RDW: 13.2 % (ref 12.3–15.4)
WBC: 6.1 10*3/uL (ref 3.4–10.8)

## 2018-09-04 LAB — CMP14+EGFR
A/G RATIO: 1.7 (ref 1.2–2.2)
ALK PHOS: 51 IU/L (ref 39–117)
ALT: 20 IU/L (ref 0–44)
AST: 15 IU/L (ref 0–40)
Albumin: 4.2 g/dL (ref 3.6–4.8)
BUN/Creatinine Ratio: 13 (ref 10–24)
BUN: 14 mg/dL (ref 8–27)
Bilirubin Total: 0.4 mg/dL (ref 0.0–1.2)
CO2: 24 mmol/L (ref 20–29)
Calcium: 9.2 mg/dL (ref 8.6–10.2)
Chloride: 101 mmol/L (ref 96–106)
Creatinine, Ser: 1.12 mg/dL (ref 0.76–1.27)
GFR calc Af Amer: 79 mL/min/{1.73_m2} (ref >59)
GFR calc non Af Amer: 68 mL/min/{1.73_m2} (ref >59)
GLOBULIN, TOTAL: 2.5 g/dL (ref 1.5–4.5)
Glucose: 91 mg/dL (ref 65–99)
POTASSIUM: 3.9 mmol/L (ref 3.5–5.2)
SODIUM: 139 mmol/L (ref 134–144)
Total Protein: 6.7 g/dL (ref 6.0–8.5)

## 2018-09-04 LAB — LIPID PANEL
CHOL/HDL RATIO: 2.9 ratio (ref 0.0–5.0)
CHOLESTEROL TOTAL: 144 mg/dL (ref 100–199)
HDL: 50 mg/dL (ref >39)
LDL Calculated: 78 mg/dL (ref 0–99)
TRIGLYCERIDES: 82 mg/dL (ref 0–149)
VLDL Cholesterol Cal: 16 mg/dL (ref 5–40)

## 2018-09-04 LAB — HEPATITIS C ANTIBODY: Hep C Virus Ab: <0.1 s/co ratio (ref 0.0–0.9)

## 2018-09-04 LAB — TSH: TSH: 2.48 u[IU]/mL (ref 0.450–4.500)

## 2018-09-04 NOTE — Addendum Note (Signed)
Addended by: Baruch Gouty on: 09/04/2018 11:49 AM   Modules accepted: Level of Service

## 2018-10-09 ENCOUNTER — Telehealth: Payer: Self-pay | Admitting: *Deleted

## 2018-10-09 MED ORDER — SIMVASTATIN 20 MG PO TABS: 20.0000 mg | ORAL_TABLET | Freq: Every day | ORAL | 1 refills | Status: DC

## 2018-10-09 NOTE — Telephone Encounter (Signed)
Pt aware refill sent to pharmacy 

## 2018-10-17 ENCOUNTER — Encounter: Payer: Medicare Other | Admitting: Gastroenterology

## 2018-12-04 ENCOUNTER — Other Ambulatory Visit: Payer: Self-pay | Admitting: *Deleted

## 2018-12-04 DIAGNOSIS — M1000 Idiopathic gout, unspecified site: Secondary | ICD-10-CM

## 2018-12-04 MED ORDER — ALLOPURINOL 300 MG PO TABS: 300.0000 mg | ORAL_TABLET | Freq: Every evening | ORAL | 3 refills | Status: DC

## 2019-02-05 ENCOUNTER — Encounter: Payer: Self-pay | Admitting: *Deleted

## 2019-02-11 ENCOUNTER — Emergency Department (HOSPITAL_COMMUNITY): Payer: Medicare Other

## 2019-02-11 ENCOUNTER — Encounter (HOSPITAL_COMMUNITY): Payer: Self-pay

## 2019-02-11 ENCOUNTER — Emergency Department (HOSPITAL_COMMUNITY)
Admission: EM | Admit: 2019-02-11 | Discharge: 2019-02-11 | Disposition: A | Discharge status: Home / Self Care | Payer: Medicare Other | Attending: Internal Medicine | Admitting: Internal Medicine

## 2019-02-11 ENCOUNTER — Other Ambulatory Visit: Payer: Self-pay

## 2019-02-11 DIAGNOSIS — K219 Gastro-esophageal reflux disease without esophagitis: Secondary | ICD-10-CM | POA: Diagnosis not present

## 2019-02-11 DIAGNOSIS — R4701 Aphasia: Secondary | ICD-10-CM

## 2019-02-11 DIAGNOSIS — G9340 Encephalopathy, unspecified: Secondary | ICD-10-CM | POA: Diagnosis not present

## 2019-02-11 DIAGNOSIS — R52 Pain, unspecified: Secondary | ICD-10-CM | POA: Diagnosis not present

## 2019-02-11 DIAGNOSIS — R410 Disorientation, unspecified: Secondary | ICD-10-CM

## 2019-02-11 DIAGNOSIS — Z96651 Presence of right artificial knee joint: Secondary | ICD-10-CM | POA: Insufficient documentation

## 2019-02-11 DIAGNOSIS — Z7982 Long term (current) use of aspirin: Secondary | ICD-10-CM | POA: Insufficient documentation

## 2019-02-11 DIAGNOSIS — R404 Transient alteration of awareness: Secondary | ICD-10-CM | POA: Diagnosis not present

## 2019-02-11 DIAGNOSIS — R4182 Altered mental status, unspecified: Secondary | ICD-10-CM | POA: Diagnosis present

## 2019-02-11 DIAGNOSIS — E785 Hyperlipidemia, unspecified: Secondary | ICD-10-CM | POA: Insufficient documentation

## 2019-02-11 DIAGNOSIS — I6782 Cerebral ischemia: Secondary | ICD-10-CM | POA: Diagnosis not present

## 2019-02-11 DIAGNOSIS — C8590 Non-Hodgkin lymphoma, unspecified, unspecified site: Secondary | ICD-10-CM

## 2019-02-11 DIAGNOSIS — M1A9XX0 Chronic gout, unspecified, without tophus (tophi): Secondary | ICD-10-CM | POA: Insufficient documentation

## 2019-02-11 DIAGNOSIS — R51 Headache: Secondary | ICD-10-CM | POA: Diagnosis not present

## 2019-02-11 DIAGNOSIS — Z79899 Other long term (current) drug therapy: Secondary | ICD-10-CM | POA: Insufficient documentation

## 2019-02-11 DIAGNOSIS — R0902 Hypoxemia: Secondary | ICD-10-CM | POA: Diagnosis not present

## 2019-02-11 DIAGNOSIS — Z8572 Personal history of non-Hodgkin lymphomas: Secondary | ICD-10-CM | POA: Insufficient documentation

## 2019-02-11 DIAGNOSIS — I6523 Occlusion and stenosis of bilateral carotid arteries: Secondary | ICD-10-CM | POA: Diagnosis not present

## 2019-02-11 LAB — URINALYSIS, ROUTINE W REFLEX MICROSCOPIC
Bilirubin Urine: NEGATIVE
Glucose, UA: NEGATIVE mg/dL
Hgb urine dipstick: NEGATIVE
Ketones, ur: NEGATIVE mg/dL
Leukocytes,Ua: NEGATIVE
Nitrite: NEGATIVE
Protein, ur: NEGATIVE mg/dL
Specific Gravity, Urine: 1.021 (ref 1.005–1.030)
pH: 8 (ref 5.0–8.0)

## 2019-02-11 LAB — RAPID URINE DRUG SCREEN, HOSP PERFORMED
Amphetamines: NOT DETECTED
Barbiturates: NOT DETECTED
Benzodiazepines: NOT DETECTED
Cocaine: NOT DETECTED
Opiates: NOT DETECTED
Tetrahydrocannabinol: NOT DETECTED

## 2019-02-11 LAB — CBC
HCT: 46.8 % (ref 39.0–52.0)
Hemoglobin: 15.7 g/dL (ref 13.0–17.0)
MCH: 31.2 pg (ref 26.0–34.0)
MCHC: 33.5 g/dL (ref 30.0–36.0)
MCV: 92.9 fL (ref 80.0–100.0)
Platelets: 209 10*3/uL (ref 150–400)
RBC: 5.04 MIL/uL (ref 4.22–5.81)
RDW: 13.2 % (ref 11.5–15.5)
WBC: 5.5 10*3/uL (ref 4.0–10.5)
nRBC: 0 % (ref 0.0–0.2)

## 2019-02-11 LAB — TROPONIN I: Troponin I: <0.03 ng/mL (ref <0.03)

## 2019-02-11 LAB — COMPREHENSIVE METABOLIC PANEL
ALT: 29 U/L (ref 0–44)
AST: 22 U/L (ref 15–41)
Albumin: 4.5 g/dL (ref 3.5–5.0)
Alkaline Phosphatase: 53 U/L (ref 38–126)
Anion gap: 8 (ref 5–15)
BUN: 17 mg/dL (ref 8–23)
CO2: 28 mmol/L (ref 22–32)
Calcium: 8.9 mg/dL (ref 8.9–10.3)
Chloride: 103 mmol/L (ref 98–111)
Creatinine, Ser: 1.13 mg/dL (ref 0.61–1.24)
GFR calc Af Amer: >60 mL/min (ref >60)
GFR calc non Af Amer: >60 mL/min (ref >60)
Glucose, Bld: 121 mg/dL — ABNORMAL HIGH (ref 70–99)
Potassium: 4.1 mmol/L (ref 3.5–5.1)
Sodium: 139 mmol/L (ref 135–145)
Total Bilirubin: 0.9 mg/dL (ref 0.3–1.2)
Total Protein: 7.3 g/dL (ref 6.5–8.1)

## 2019-02-11 LAB — DIFFERENTIAL
Abs Immature Granulocytes: 0.02 10*3/uL (ref 0.00–0.07)
Basophils Absolute: 0 10*3/uL (ref 0.0–0.1)
Basophils Relative: 1 %
Eosinophils Absolute: 0.3 10*3/uL (ref 0.0–0.5)
Eosinophils Relative: 5 %
Immature Granulocytes: 0 %
Lymphocytes Relative: 24 %
Lymphs Abs: 1.3 10*3/uL (ref 0.7–4.0)
Monocytes Absolute: 0.5 10*3/uL (ref 0.1–1.0)
Monocytes Relative: 10 %
Neutro Abs: 3.3 10*3/uL (ref 1.7–7.7)
Neutrophils Relative %: 60 %

## 2019-02-11 LAB — CBG MONITORING, ED: Glucose-Capillary: 113 mg/dL — ABNORMAL HIGH (ref 70–99)

## 2019-02-11 LAB — I-STAT CREATININE, ED: Creatinine, Ser: 1.2 mg/dL (ref 0.61–1.24)

## 2019-02-11 LAB — PROTIME-INR
INR: 1 (ref 0.8–1.2)
Prothrombin Time: 13.4 seconds (ref 11.4–15.2)

## 2019-02-11 LAB — ETHANOL: Alcohol, Ethyl (B): <10 mg/dL (ref <10)

## 2019-02-11 LAB — APTT: aPTT: 33 seconds (ref 24–36)

## 2019-02-11 MED ORDER — LORAZEPAM 2 MG/ML IJ SOLN
1.0000 mg | Freq: Once | INTRAMUSCULAR | Status: AC
  Administered 2019-02-11: 1 mg via INTRAVENOUS
  Filled 2019-02-11: qty 1

## 2019-02-11 MED ORDER — IOPAMIDOL (ISOVUE-370) INJECTION 76%
115.0000 mL | Freq: Once | INTRAVENOUS | Status: AC | PRN
  Administered 2019-02-11: 115 mL via INTRAVENOUS

## 2019-02-11 MED ORDER — IOHEXOL 350 MG/ML SOLN: 150.0000 mL | Freq: Once | INTRAVENOUS | Status: DC | PRN

## 2019-02-11 NOTE — Consult Note (Signed)
[facility]rology Consult Services   Date of Service:   02/11/2019 12:29:48  Impression:     .  Transient Ischemic Attack  Comments/Sign-Out: Patient exam is completely normal. He has no deficits. NIH stroke scale is 0. I think we are dealing with possible transient global amnesia versus other causes of encephalopathy. I do not see any clear-cut evidence of stroke, seizures. I would recommend getting an MRI of the head with and without contrast. The plain CT of the head, CTA and CTP are grossly negative. All this was discussed with the ER attending, patient and the team at the bedside  Metrics: Last Known Well: 02/11/2019 05:30:00 [facility] Notification Time: 02/11/2019 12:29:48 Arrival Time: 02/11/2019 12:00:00 Stamp Time: 02/11/2019 12:29:48 Time First Login Attempt: 02/11/2019 12:38:07 Video Start Time: 02/11/2019 12:38:07  Symptoms: Confusion NIHSS Start Assessment Time: 02/11/2019 12:53:14 Patient is not a candidate for tPA. Patient was not deemed candidate for tPA thrombolytics because of Last Well Known Above 4.5 Hours. Video End Time: 02/11/2019 13:02:50  CT head showed no acute hemorrhage or acute core infarct.  Lower Likelihood of Large Vessel Occlusion but Following Stat Studies are Recommended  CTA Head and Neck. CT Perfusion. Reviewed, No Indication of Large Vessel Occlusive Thrombus, Patient is not an NIR Candidate.   ED Physician notified of diagnostic impression and management plan on 02/11/2019 13:06:52  Our recommendations are outlined below.  Recommendations:     .  Activate Stroke Protocol Admission/Order Set     .  Stroke/Telemetry Floor     .  Neuro Checks     .  Bedside Swallow Eval     .  DVT Prophylaxis     .  IV Fluids, Normal Saline     .  Head of Bed Below 30 Degrees     .  Euglycemia and Avoid Hyperthermia (PRN Acetaminophen)     .  Antiplatelet Therapy Recommended  Routine Consultation with [facility] for Follow up Care  Sign Out:     .  Discussed with Emergency Department Provider     .  Discussed with Rapid Response Team    ------------------------------------------------------------------------------  History of Present Illness: Patient is a 68 year old Male.  Patient was brought by EMS for symptoms of Confusion  Pleasant 68 year old male with history of lymphoma came to the hospital because of confusion. He reports he woke up around 5/530 this morning and then went to sleep. Woke up around 930/10 and had some confusion. He denies any problem with his speech or swallowing. There was no focal weakness. There was no tonic-clonic jerking or tongue biting. No seizures were reported. No focal weakness or falls were reported.  CT head showed no acute hemorrhage or acute core infarct.  Last seen normal was beyond 4.5 hours of presentation. There is no history of hemorrhagic complications or intracranial hemorrhage. There is no history of Recent Anticoagulants. There is no history of recent major surgery. There is no history of recent stroke.  Examination: BP(167/107), Pulse(79), Blood Glucose(113) 1A: Level of Consciousness - Alert; keenly responsive + 0 1B: Ask Month and Age - Both Questions Right + 0 1C: Blink Eyes & Squeeze Hands - Performs Both Tasks + 0 2: Test Horizontal Extraocular Movements - Normal + 0 3: Test Visual Fields - No Visual Loss + 0 4: Test Facial Palsy (Use Grimace if Obtunded) - Normal symmetry + 0 5A: Test Left Arm Motor Drift - No Drift for 10 Seconds + 0 5B: Test  Right Arm Motor Drift - No Drift for 10 Seconds + 0 6A: Test Left Leg Motor Drift - No Drift for 5 Seconds + 0 6B: Test Right Leg Motor Drift - No Drift for 5 Seconds + 0 7: Test Limb Ataxia (FNF/Heel-Shin) - No Ataxia + 0 8: Test Sensation - Normal; No sensory loss + 0 9: Test Language/Aphasia - Normal; No aphasia + 0 10: Test Dysarthria - Normal + 0 11: Test Extinction/Inattention  - No abnormality + 0  NIHSS Score: 0  Patient was informed the Neurology Consult would happen via TeleHealth consult by way of interactive audio and video telecommunications and consented to receiving care in this manner.  Due to the immediate potential for life-threatening deterioration due to underlying acute neurologic illness, I spent 35 minutes providing critical care. This time includes time for face to face visit via telemedicine, review of medical records, imaging studies and discussion of findings with providers, the patient and/or family.   Dr Faustino Congress   [facility] [phone]  Case [id number]

## 2019-02-11 NOTE — H&P (Addendum)
Consultation   Rip Hawes Salvaggio HWE:[id number] DOB: 1951/03/04 DOA: 02/11/2019  PCP: Baruch Gouty, FNP   Patient coming from: Home  Chief Complaint: Acute confusion   HPI: [name] is a 68 y.o. male with medical history significant for non-Hodgkin's lymphoma, gout, GERD, and dyslipidemia who presented to the ED after he woke up at approximately 9:30 AM this morning with confusion.  He did not have any problem with speech or swallowing and there was no signs of any focal weakness, tonic-clonic jerking, or tongue biting.  He did not have any falls or sensory deficits otherwise identified.  No gait disturbance noted.  He takes his medications regularly and has not had symptoms similar to this in the past.   ED Course: Vital signs as well as lab work are noted to be stable.  Multiple head and neck imaging done thus far with no acute hemorrhage or acute cord infarct otherwise noted.  MRI of the brain currently pending.  Patient continues to have some ongoing mild confusion at this time.  Review of Systems: All others reviewed and otherwise negative.  Past Medical History:  Diagnosis Date   Arthritis    "knees, fingers, elbows, toes" (02/19/2018)   Dyslipidemia    GERD (gastroesophageal reflux disease)    Gout    "take RX daily" (02/19/2018)   Non Hodgkin's lymphoma, stage IIA (HCC) 1998   non hodgkins lymphoma stage II   Primary localized osteoarthrosis of the knee, right    Tinnitus of left ear     Past Surgical History:  Procedure Laterality Date   COLONOSCOPY     JOINT REPLACEMENT     KNEE ARTHROSCOPY Right 10/31/06   MOUTH SURGERY     "related to replacing crown; reduced the pedistal"   TOE SURGERY Bilateral    Great toe each foot from athletic injuries; "ground the top of the bone off"   TOTAL KNEE ARTHROPLASTY Right 02/19/2018   TOTAL KNEE ARTHROPLASTY Right 02/19/2018   Procedure: TOTAL KNEE ARTHROPLASTY;  Surgeon: Elsie Saas, MD;   Location: [facility];  Service: Orthopedics;  Laterality: Right;     reports that he has never smoked. He has never used smokeless tobacco. He reports current alcohol use of about 3.0 standard drinks of alcohol per week. He reports that he does not use drugs.  Allergies  Allergen Reactions   Indomethacin     Disoriented    Family History  Problem Relation Age of Onset   Diabetes Mother    Congestive Heart Failure Mother    Diabetes Maternal Grandmother     Prior to Admission medications   Medication Sig Start Date End Date Taking? Authorizing Provider  acetaminophen (TYLENOL) 325 MG tablet Take 2 tablets (650 mg total) by mouth every 6 (six) hours as needed for mild pain. 02/20/18  Yes Shepperson, Kirstin, PA-C  allopurinol (ZYLOPRIM) 300 MG tablet Take 1 tablet (300 mg total) by mouth every evening. 12/04/18 04/03/19 Yes Rakes, Connye Burkitt, FNP  desonide (DESOWEN) 0.05 % cream Apply 1 application topically 2 (two) times daily as needed.   Yes [provider]  pantoprazole (PROTONIX) 40 MG tablet TAKE 1 TABLET BY MOUTH  DAILY AFTER DINNER 09/03/18  Yes Rakes, Connye Burkitt, FNP  simvastatin (ZOCOR) 20 MG tablet Take 1 tablet (20 mg total) by mouth at bedtime. 10/09/18  Yes Baruch Gouty, FNP    Physical Exam: Vitals:   02/11/19 1208 02/11/19 1209 02/11/19 1230  BP: (!) 167/107  (!) 146/88  Pulse: 79  74  Resp: 15  14  Temp: 98.1 F (36.7 C)    TempSrc: Oral    SpO2: 94%  97%  Weight:  85.7 kg   Height:  5' 10.5" (1.791 m)     Constitutional: NAD, calm, comfortable Vitals:   02/11/19 1208 02/11/19 1209 02/11/19 1230  BP: (!) 167/107  (!) 146/88  Pulse: 79  74  Resp: 15  14  Temp: 98.1 F (36.7 C)    TempSrc: Oral    SpO2: 94%  97%  Weight:  85.7 kg   Height:  5' 10.5" (1.791 m)    Eyes: lids and conjunctivae normal ENMT: Mucous membranes are moist.  Neck: normal, supple Respiratory: clear to auscultation bilaterally. Normal respiratory effort. No accessory muscle use.   Cardiovascular: Regular rate and rhythm, no murmurs. No extremity edema. Abdomen: no tenderness, no distention. Bowel sounds positive.  Musculoskeletal:  No joint deformity upper and lower extremities.   Skin: no rashes, lesions, ulcers.  Psychiatric: Normal judgment and insight. Alert and oriented x 3. Normal mood.  Neurological: No focal sensory or motor deficits currently identified.  Labs on Admission: I have personally reviewed following labs and imaging studies  CBC: Recent Labs  Lab 02/11/19 1206  WBC 5.5  NEUTROABS 3.3  HGB 15.7  HCT 46.8  MCV 92.9  PLT 024   Basic Metabolic Panel: Recent Labs  Lab 02/11/19 1206 02/11/19 1229  NA 139  --   K 4.1  --   CL 103  --   CO2 28  --   GLUCOSE 121*  --   BUN 17  --   CREATININE 1.13 1.20  CALCIUM 8.9  --    GFR: Estimated Creatinine Clearance: 62.7 mL/min (by C-G formula based on SCr of 1.2 mg/dL). Liver Function Tests: Recent Labs  Lab 02/11/19 1206  AST 22  ALT 29  ALKPHOS 53  BILITOT 0.9  PROT 7.3  ALBUMIN 4.5   No results for input(s): LIPASE, AMYLASE in the last 168 hours. No results for input(s): AMMONIA in the last 168 hours. Coagulation Profile: Recent Labs  Lab 02/11/19 1206  INR 1.0   Cardiac Enzymes: No results for input(s): CKTOTAL, CKMB, CKMBINDEX, TROPONINI in the last 168 hours. BNP (last 3 results) No results for input(s): PROBNP in the last 8760 hours. HbA1C: No results for input(s): HGBA1C in the last 72 hours. CBG: Recent Labs  Lab 02/11/19 1205  GLUCAP 113*   Lipid Profile: No results for input(s): CHOL, HDL, LDLCALC, TRIG, CHOLHDL, LDLDIRECT in the last 72 hours. Thyroid Function Tests: No results for input(s): TSH, T4TOTAL, FREET4, T3FREE, THYROIDAB in the last 72 hours. Anemia Panel: No results for input(s): VITAMINB12, FOLATE, FERRITIN, TIBC, IRON, RETICCTPCT in the last 72 hours. Urine analysis:    Component Value Date/Time   COLORURINE COLORLESS (A) 02/11/2019 1214    APPEARANCEUR CLEAR 02/11/2019 1214   LABSPEC 1.021 02/11/2019 1214   PHURINE 8.0 02/11/2019 1214   GLUCOSEU NEGATIVE 02/11/2019 1214   HGBUR NEGATIVE 02/11/2019 1214   BILIRUBINUR NEGATIVE 02/11/2019 1214   KETONESUR NEGATIVE 02/11/2019 1214   PROTEINUR NEGATIVE 02/11/2019 1214   NITRITE NEGATIVE 02/11/2019 1214   LEUKOCYTESUR NEGATIVE 02/11/2019 1214    Radiological Exams on Admission: Ct Angio Head W Or Wo Contrast  Result Date: 02/11/2019 CLINICAL DATA:  Acute confusion EXAM: CT ANGIOGRAPHY HEAD AND NECK CT PERFUSION BRAIN TECHNIQUE: Multidetector CT imaging of the head and neck was performed using the standard protocol during bolus  administration of intravenous contrast. Multiplanar CT image reconstructions and MIPs were obtained to evaluate the vascular anatomy. Carotid stenosis measurements (when applicable) are obtained utilizing NASCET criteria, using the distal internal carotid diameter as the denominator. Multiphase CT imaging of the brain was performed following IV bolus contrast injection. Subsequent parametric perfusion maps were calculated using RAPID software. CONTRAST:  167mL ISOVUE-370 IOPAMIDOL (ISOVUE-370) INJECTION 76% COMPARISON:  Head CT earlier same day FINDINGS: CTA NECK FINDINGS Aortic arch: Aortic arch appears normal. Right carotid system: Common carotid artery widely patent to the bifurcation region. Mild atherosclerotic plaque at the bifurcation but no stenosis. Left carotid system: Common carotid artery widely patent to the bifurcation. Mild atherosclerotic plaque at the bifurcation but no stenosis. Vertebral arteries: Both vertebral artery origins widely patent. Both vertebral arteries widely patent through the cervical region to the foramen magnum. Skeleton: Ordinary cervical spondylosis and facet arthropathy. Other neck: No mass or lymphadenopathy. Upper chest: Negative Review of the MIP images confirms the above findings CTA HEAD FINDINGS Anterior circulation: Both  internal carotid arteries are widely patent through the skull base and siphon regions. The anterior and middle cerebral vessels are patent without proximal stenosis, aneurysm or vascular malformation. No occluded vessel is identified. Posterior circulation: Both vertebral arteries widely patent to the basilar. Basilar stenosis. Posterior circulation branch vessels appear. Venous sinuses: Patent and. Anatomic variants: None significant. Delayed phase: No abnormal enhancement. Review of the MIP images confirms the above findings CT Brain Perfusion Findings: CBF (<30%) Volume: 56mL Perfusion (Tmax>6.0s) volume: 71mL Mismatch Volume: 16mL Infarction Location:None IMPRESSION: Normal perfusion examination. No evidence of large or medium vessel occlusion. Very minimal atherosclerotic change at both carotid bifurcations without stenosis. These results were called by telephone at the time of interpretation on 02/11/2019 at 1:06 pm to Dr. Francine Graven , who verbally acknowledged these results. Electronically Signed   By: Nelson Chimes M.D.   On: 02/11/2019 13:07   Ct Angio Neck W Or Wo Contrast  Result Date: 02/11/2019 CLINICAL DATA:  Acute confusion EXAM: CT ANGIOGRAPHY HEAD AND NECK CT PERFUSION BRAIN TECHNIQUE: Multidetector CT imaging of the head and neck was performed using the standard protocol during bolus administration of intravenous contrast. Multiplanar CT image reconstructions and MIPs were obtained to evaluate the vascular anatomy. Carotid stenosis measurements (when applicable) are obtained utilizing NASCET criteria, using the distal internal carotid diameter as the denominator. Multiphase CT imaging of the brain was performed following IV bolus contrast injection. Subsequent parametric perfusion maps were calculated using RAPID software. CONTRAST:  159mL ISOVUE-370 IOPAMIDOL (ISOVUE-370) INJECTION 76% COMPARISON:  Head CT earlier same day FINDINGS: CTA NECK FINDINGS Aortic arch: Aortic arch appears normal.  Right carotid system: Common carotid artery widely patent to the bifurcation region. Mild atherosclerotic plaque at the bifurcation but no stenosis. Left carotid system: Common carotid artery widely patent to the bifurcation. Mild atherosclerotic plaque at the bifurcation but no stenosis. Vertebral arteries: Both vertebral artery origins widely patent. Both vertebral arteries widely patent through the cervical region to the foramen magnum. Skeleton: Ordinary cervical spondylosis and facet arthropathy. Other neck: No mass or lymphadenopathy. Upper chest: Negative Review of the MIP images confirms the above findings CTA HEAD FINDINGS Anterior circulation: Both internal carotid arteries are widely patent through the skull base and siphon regions. The anterior and middle cerebral vessels are patent without proximal stenosis, aneurysm or vascular malformation. No occluded vessel is identified. Posterior circulation: Both vertebral arteries widely patent to the basilar. Basilar stenosis. Posterior circulation branch vessels appear. Venous  sinuses: Patent and. Anatomic variants: None significant. Delayed phase: No abnormal enhancement. Review of the MIP images confirms the above findings CT Brain Perfusion Findings: CBF (<30%) Volume: 32mL Perfusion (Tmax>6.0s) volume: 38mL Mismatch Volume: 24mL Infarction Location:None IMPRESSION: Normal perfusion examination. No evidence of large or medium vessel occlusion. Very minimal atherosclerotic change at both carotid bifurcations without stenosis. These results were called by telephone at the time of interpretation on 02/11/2019 at 1:06 pm to Dr. Francine Graven , who verbally acknowledged these results. Electronically Signed   By: Nelson Chimes M.D.   On: 02/11/2019 13:07   Ct Cerebral Perfusion W Contrast  Result Date: 02/11/2019 CLINICAL DATA:  Acute confusion EXAM: CT ANGIOGRAPHY HEAD AND NECK CT PERFUSION BRAIN TECHNIQUE: Multidetector CT imaging of the head and neck was  performed using the standard protocol during bolus administration of intravenous contrast. Multiplanar CT image reconstructions and MIPs were obtained to evaluate the vascular anatomy. Carotid stenosis measurements (when applicable) are obtained utilizing NASCET criteria, using the distal internal carotid diameter as the denominator. Multiphase CT imaging of the brain was performed following IV bolus contrast injection. Subsequent parametric perfusion maps were calculated using RAPID software. CONTRAST:  123mL ISOVUE-370 IOPAMIDOL (ISOVUE-370) INJECTION 76% COMPARISON:  Head CT earlier same day FINDINGS: CTA NECK FINDINGS Aortic arch: Aortic arch appears normal. Right carotid system: Common carotid artery widely patent to the bifurcation region. Mild atherosclerotic plaque at the bifurcation but no stenosis. Left carotid system: Common carotid artery widely patent to the bifurcation. Mild atherosclerotic plaque at the bifurcation but no stenosis. Vertebral arteries: Both vertebral artery origins widely patent. Both vertebral arteries widely patent through the cervical region to the foramen magnum. Skeleton: Ordinary cervical spondylosis and facet arthropathy. Other neck: No mass or lymphadenopathy. Upper chest: Negative Review of the MIP images confirms the above findings CTA HEAD FINDINGS Anterior circulation: Both internal carotid arteries are widely patent through the skull base and siphon regions. The anterior and middle cerebral vessels are patent without proximal stenosis, aneurysm or vascular malformation. No occluded vessel is identified. Posterior circulation: Both vertebral arteries widely patent to the basilar. Basilar stenosis. Posterior circulation branch vessels appear. Venous sinuses: Patent and. Anatomic variants: None significant. Delayed phase: No abnormal enhancement. Review of the MIP images confirms the above findings CT Brain Perfusion Findings: CBF (<30%) Volume: 64mL Perfusion (Tmax>6.0s)  volume: 3mL Mismatch Volume: 72mL Infarction Location:None IMPRESSION: Normal perfusion examination. No evidence of large or medium vessel occlusion. Very minimal atherosclerotic change at both carotid bifurcations without stenosis. These results were called by telephone at the time of interpretation on 02/11/2019 at 1:06 pm to Dr. Francine Graven , who verbally acknowledged these results. Electronically Signed   By: Nelson Chimes M.D.   On: 02/11/2019 13:07   Ct Head Code Stroke Wo Contrast  Result Date: 02/11/2019 CLINICAL DATA:  Code stroke.  Acute confusion upon wakening today. EXAM: CT HEAD WITHOUT CONTRAST TECHNIQUE: Contiguous axial images were obtained from the base of the skull through the vertex without intravenous contrast. COMPARISON:  None. FINDINGS: Brain: The brain appears normal without evidence of old or acute infarction, mass lesion, hemorrhage, hydrocephalus or extra-axial collection. Vascular: No abnormal vascular finding. Skull: Normal Sinuses/Orbits: Clear/normal Other: None ASPECTS ([place] Stroke Program Early CT Score) - Ganglionic level infarction (caudate, lentiform nuclei, internal capsule, insula, M1-M3 cortex): 7 - Supraganglionic infarction (M4-M6 cortex): 3 Total score (0-10 with 10 being normal): 10 IMPRESSION: 1. Normal study 2. ASPECTS is 10. 3. These results were called by telephone  at the time of interpretation on 02/11/2019 at 12:34 pm to Dr. Francine Graven , who verbally acknowledged these results. Electronically Signed   By: Nelson Chimes M.D.   On: 02/11/2019 12:35    EKG: Independently reviewed. SR 80bpm.  Assessment/Plan Principal Problem:   Acute encephalopathy Active Problems:   Dyslipidemia   Lymphoma (HCC)   GERD (gastroesophageal reflux disease)    1. Acute encephalopathy with confusion.  Suspect that this may be related to TIA event.  May follow-up with [facility] as an outpatient and also have echocardiogram outpatient as needed.  Recommend discharge on  aspirin 81 mg daily at this point in time. 2. Dyslipidemia.  Continue statin and have lipid levels checked as an outpatient. 3. GERD.  PPI. 4. Chronic gout.  Continue on allopurinol. 5. History of non-Hodgkin's lymphoma.  Currently in remission and no longer on chemotherapy.  Patient is stable for discharge at this point in time with no persistent deficit of any kind currently noted.  Discussed with EDP that patient should remain on aspirin 81 mg for now and follow-up with Dr. Merlene Laughter in the outpatient setting in the next 3 to 4 weeks and also have echo in the outpatient setting as needed.  Lipids and A1c to be checked by PCP.   Kea Callan Darleen Crocker DO [facility] Pager [phone]  If 7PM-7AM, please contact night-coverage [email or web address] Password [email or web address]  02/11/2019, 2:12 PM
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
## 2019-02-11 NOTE — ED Notes (Signed)
ED Provider at bedside. 

## 2019-02-11 NOTE — Discharge Instructions (Addendum)
Take aspirin 81mg  by mouth daily until you are seen in follow up. Continue your usual prescriptions as previously directed.  You will need further testing as an outpatient (ie: Echocardiogram, lipid panel, A1C check).  Call your regular medical doctor today to schedule a follow up appointment within the next 1 to 2 days. Call the Neurologist today to schedule a follow up appointment within the next 3 days. Return to the Emergency Department immediately sooner if worsening.

## 2019-02-11 NOTE — Progress Notes (Signed)
Code STROKE DOCUMENTATION CALL TIME = 12:17 BEEPER TIME = 1220 EXAM STARTED - 1222 EXAM FINISHED - 1224 IMAGES SENT TO SOC - 12:24 EXAM COMPLETED IN Epic=1228 GRA CALLED = 1228
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
## 2019-02-12 ENCOUNTER — Encounter: Payer: Self-pay | Admitting: Family Medicine

## 2019-02-12 ENCOUNTER — Ambulatory Visit (INDEPENDENT_AMBULATORY_CARE_PROVIDER_SITE_OTHER): Payer: Medicare Other | Admitting: Family Medicine

## 2019-02-12 DIAGNOSIS — Z09 Encounter for follow-up examination after completed treatment for conditions other than malignant neoplasm: Secondary | ICD-10-CM | POA: Diagnosis not present

## 2019-02-12 DIAGNOSIS — G459 Transient cerebral ischemic attack, unspecified: Secondary | ICD-10-CM | POA: Diagnosis not present

## 2019-02-12 NOTE — Progress Notes (Signed)
Virtual Visit via telephone Note Due to COVID-19, visit is conducted virtually and was requested by patient.  I connected with [place] on 02/12/19 at [facility] by telephone and verified that I am speaking with the correct person using two identifiers. [name] is currently located at home and wife is currently with them during visit. The provider, Monia Pouch, FNP is located in their office at time of visit.  I discussed the limitations, risks, security and privacy concerns of performing an evaluation and management service by telephone and the availability of in person appointments. I also discussed with the patient that there may be a patient responsible charge related to this service. The patient expressed understanding and agreed to proceed.  Subjective:  Patient ID: [name], male    DOB: 12/13/1950, 68 y.o.   MRN: [id number]  Chief Complaint:  Hospitalization Follow-up   HPI: [name] is a 68 y.o. male presenting on 02/12/2019 for Hospitalization Follow-up   Pt reports that he woke up yesterday around 0930 and was slightly confused. States he went to the ED due to the confusion and had a negative workup. States he experienced some aphasia during this time. States the symptoms completely resolved prior to discharge from the ED. He denies loss of function, slurred speech, weakness, or facial droop during the episode. Pt states he knew the answers to the questions he was being asked, but was unable to answer them. States he did eat a significant amount of ham the night before the incident and woke up extremely thirsty. He denies a headache during the episode. States he has noticed his blood pressure to be slightly elevated when checking it at home, 140/88. He denies chest pain, syncope, weakness, or continued confusion. No dizziness or palpitations. He states he has not followed up with neurology but is going to make an appointment today.     Relevant past medical, surgical, family, and social history reviewed and updated as indicated.  Allergies and medications reviewed and updated.   Past Medical History:  Diagnosis Date  . Arthritis    "knees, fingers, elbows, toes" (02/19/2018)  . Dyslipidemia   . GERD (gastroesophageal reflux disease)   . Gout    "take RX daily" (02/19/2018)  . Non Hodgkin's lymphoma, stage IIA ([place]) 1998   non hodgkins lymphoma stage II  . Primary localized osteoarthrosis of the knee, right   . Tinnitus of left ear     Past Surgical History:  Procedure Laterality Date  . COLONOSCOPY    . JOINT REPLACEMENT    . KNEE ARTHROSCOPY Right 10/31/06  . MOUTH SURGERY     "related to replacing crown; reduced the pedistal"  . TOE SURGERY Bilateral    Great toe each foot from athletic injuries; "ground the top of the bone off"  . 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 TOTAL KNEE ARTHROPLASTY Right 02/19/2018   Procedure: TOTAL KNEE ARTHROPLASTY;  Surgeon: Elsie Saas, MD;  Location: [facility];  Service: Orthopedics;  Laterality: Right;    Social History   Socioeconomic History  . Marital status: Married    Spouse name: Carmela  . Number of children: Not on file  . Years of education: 17  . Highest education level: Bachelor's degree (e.g., BA, AB, BS)  Occupational History  . Occupation: retired  Scientific laboratory technician  . Financial resource strain: Not on file  . Food insecurity:    Worry: Not on file    Inability: Not on  file  . Transportation needs:    Medical: Not on file    Non-medical: Not on file  Tobacco Use  . Smoking status: Never Smoker  . Smokeless tobacco: Never Used  Substance and Sexual Activity  . Alcohol use: Yes    Alcohol/week: 3.0 standard drinks    Types: 3 Cans of beer per week  . Drug use: No  . Sexual activity: Not Currently  Lifestyle  . Physical activity:    Days per week: Not on file    Minutes per session: Not on file  . Stress: Not on file  Relationships  .  Social connections:    Talks on phone: Not on file    Gets together: Not on file    Attends religious service: Not on file    Active member of club or organization: Not on file    Attends meetings of clubs or organizations: Not on file    Relationship status: Not on file  . Intimate partner violence:    Fear of current or ex partner: Not on file    Emotionally abused: Not on file    Physically abused: Not on file    Forced sexual activity: Not on file  Other Topics Concern  . Not on file  Social History Narrative  . Not on file    Outpatient Encounter Medications as of 02/12/2019  Medication Sig  . aspirin EC 81 MG tablet Take 81 mg by mouth daily.  Marland Kitchen acetaminophen (TYLENOL) 325 MG tablet Take 2 tablets (650 mg total) by mouth every 6 (six) hours as needed for mild pain.  Marland Kitchen allopurinol (ZYLOPRIM) 300 MG tablet Take 1 tablet (300 mg total) by mouth every evening.  Marland Kitchen desonide (DESOWEN) 0.05 % cream Apply 1 application topically 2 (two) times daily as needed.  . pantoprazole (PROTONIX) 40 MG tablet TAKE 1 TABLET BY MOUTH  DAILY AFTER DINNER  . simvastatin (ZOCOR) 20 MG tablet Take 1 tablet (20 mg total) by mouth at bedtime.   No facility-administered encounter medications on file as of 02/12/2019.     Allergies  Allergen Reactions  . Indomethacin     Disoriented    Review of Systems  Constitutional: Negative for activity change, appetite change, chills, fatigue, fever and unexpected weight change.  Eyes: Negative for photophobia and visual disturbance.  Respiratory: Negative for cough, shortness of breath and wheezing.   Cardiovascular: Negative for chest pain, palpitations and leg swelling.  Musculoskeletal: Negative for arthralgias, gait problem and myalgias.  Neurological: Negative for dizziness, tremors, seizures, syncope, facial asymmetry, speech difficulty, weakness, light-headedness, numbness and headaches.  Psychiatric/Behavioral: Negative for confusion.  All other  systems reviewed and are negative.        Observations/Objective: No vital signs or physical exam, this was a telephone or virtual health encounter.  Pt alert and oriented, answers all questions appropriately, and able to speak in full sentences.    Assessment and Plan: [name] was seen today for hospitalization follow-up.  Diagnoses and all orders for this visit:  Hospital discharge follow-up -     Ambulatory referral to Neurology  TIA (transient ischemic attack) Questionable TIA. Continue ASA 81 mg daily. Report any recurrent or new symptoms. Pt aware of signs and symptoms that require emergent evaluation. Referral to neurology. -     Ambulatory referral to Neurology     Follow Up Instructions: Return in about 1 month (around 03/14/2019), or if symptoms worsen or fail to improve.    I discussed the  assessment and treatment plan with the patient. The patient was provided an opportunity to ask questions and all were answered. The patient agreed with the plan and demonstrated an understanding of the instructions.   The patient was advised to call back or seek an in-person evaluation if the symptoms worsen or if the condition fails to improve as anticipated.  The above assessment and management plan was discussed with the patient. The patient verbalized understanding of and has agreed to the management plan. Patient is aware to call the clinic if symptoms persist or worsen. Patient is aware when to return to the clinic for a follow-up visit. Patient educated on when it is appropriate to go to the emergency department.    I provided 25 minutes of non-face-to-face time during this encounter. The call started at [place]. The call ended at 1005.   Monia Pouch, FNP-C [facility] [address] [place], [place] [address] [phone]

## 2019-02-13 ENCOUNTER — Telehealth: Payer: Self-pay

## 2019-02-13 DIAGNOSIS — R030 Elevated blood-pressure reading, without diagnosis of hypertension: Secondary | ICD-10-CM | POA: Diagnosis not present

## 2019-02-13 DIAGNOSIS — E782 Mixed hyperlipidemia: Secondary | ICD-10-CM | POA: Diagnosis not present

## 2019-02-13 DIAGNOSIS — G451 Carotid artery syndrome (hemispheric): Secondary | ICD-10-CM | POA: Diagnosis not present

## 2019-02-13 DIAGNOSIS — M130 Polyarthritis, unspecified: Secondary | ICD-10-CM | POA: Diagnosis not present

## 2019-02-13 NOTE — Telephone Encounter (Signed)
Received an order from Dr. Merlene Laughter for an echo.  Called office [phone] and left a message for Raquel Sarna. We will need for her to contact [facility] at this time to schedule the echo.

## 2019-02-14 ENCOUNTER — Telehealth: Payer: Self-pay | Admitting: Family Medicine

## 2019-02-14 NOTE — Telephone Encounter (Signed)
No I want him to continue to monitor the BP, if persistently above 140/90, will initiate medications for BP.

## 2019-02-14 NOTE — Telephone Encounter (Signed)
Patient aware and verbalizes understanding. 

## 2019-02-19 DIAGNOSIS — M1711 Unilateral primary osteoarthritis, right knee: Secondary | ICD-10-CM | POA: Diagnosis not present

## 2019-02-22 ENCOUNTER — Telehealth: Payer: Self-pay | Admitting: Family Medicine

## 2019-02-22 NOTE — Telephone Encounter (Signed)
LM for pt to call for TELE visit MON with Sharyn Lull.

## 2019-02-22 NOTE — Telephone Encounter (Signed)
Rakes done a Tele visit on pt 02/12/19. No meds were changed, referral was placed for Neurology.   I called pt: Checking BP 3-4 times a day avg is 144/91 to 158/95. Once was 137/109.  He is not on any meds for BP  Went to neurology last week - they recommended some test.  Schedule 1 for 04/09/19 and the other is still scheduled.  What does he need to do in the mean time  He has some occasional dizziness  Willing to try a BP med. Will watch BP closely

## 2019-02-22 NOTE — Telephone Encounter (Signed)
Schedule visit with Rakes. Her call as to whether on site or phone.

## 2019-02-25 ENCOUNTER — Other Ambulatory Visit: Payer: Self-pay | Admitting: Family Medicine

## 2019-02-25 DIAGNOSIS — I10 Essential (primary) hypertension: Secondary | ICD-10-CM

## 2019-02-25 MED ORDER — CHLORTHALIDONE 25 MG PO TABS: 25.0000 mg | ORAL_TABLET | Freq: Every day | ORAL | 1 refills | Status: DC

## 2019-02-25 NOTE — Telephone Encounter (Signed)
Pt aware of new med and appt made for 2 weeks

## 2019-02-25 NOTE — Telephone Encounter (Signed)
I will send in low dose chlorthalidone. I will need to see him in two weeks to recheck his CMP. Continue to check blood pressure daily and report any highs or lows.

## 2019-03-12 ENCOUNTER — Ambulatory Visit: Payer: Medicare Other | Admitting: Family Medicine

## 2019-03-14 DIAGNOSIS — G40219 Localization-related (focal) (partial) symptomatic epilepsy and epileptic syndromes with complex partial seizures, intractable, without status epilepticus: Secondary | ICD-10-CM | POA: Diagnosis not present

## 2019-03-16 ENCOUNTER — Other Ambulatory Visit: Payer: Self-pay | Admitting: Family Medicine

## 2019-03-18 ENCOUNTER — Other Ambulatory Visit: Payer: Self-pay | Admitting: Family Medicine

## 2019-03-18 DIAGNOSIS — G459 Transient cerebral ischemic attack, unspecified: Secondary | ICD-10-CM | POA: Diagnosis not present

## 2019-03-18 DIAGNOSIS — E7800 Pure hypercholesterolemia, unspecified: Secondary | ICD-10-CM | POA: Diagnosis not present

## 2019-03-18 DIAGNOSIS — Z79899 Other long term (current) drug therapy: Secondary | ICD-10-CM | POA: Diagnosis not present

## 2019-03-18 DIAGNOSIS — M19049 Primary osteoarthritis, unspecified hand: Secondary | ICD-10-CM | POA: Diagnosis not present

## 2019-03-18 DIAGNOSIS — M109 Gout, unspecified: Secondary | ICD-10-CM | POA: Diagnosis not present

## 2019-03-18 DIAGNOSIS — K219 Gastro-esophageal reflux disease without esophagitis: Secondary | ICD-10-CM | POA: Diagnosis not present

## 2019-03-18 DIAGNOSIS — I10 Essential (primary) hypertension: Secondary | ICD-10-CM | POA: Diagnosis not present

## 2019-05-16 DIAGNOSIS — N400 Enlarged prostate without lower urinary tract symptoms: Secondary | ICD-10-CM | POA: Diagnosis not present

## 2019-05-16 DIAGNOSIS — Z8572 Personal history of non-Hodgkin lymphomas: Secondary | ICD-10-CM | POA: Diagnosis not present

## 2019-05-16 DIAGNOSIS — R351 Nocturia: Secondary | ICD-10-CM | POA: Diagnosis not present

## 2019-05-16 DIAGNOSIS — N528 Other male erectile dysfunction: Secondary | ICD-10-CM | POA: Diagnosis not present

## 2019-05-21 DIAGNOSIS — K219 Gastro-esophageal reflux disease without esophagitis: Secondary | ICD-10-CM | POA: Diagnosis not present

## 2019-05-21 DIAGNOSIS — E7800 Pure hypercholesterolemia, unspecified: Secondary | ICD-10-CM | POA: Diagnosis not present

## 2019-05-21 DIAGNOSIS — Z79899 Other long term (current) drug therapy: Secondary | ICD-10-CM | POA: Diagnosis not present

## 2019-05-21 DIAGNOSIS — G459 Transient cerebral ischemic attack, unspecified: Secondary | ICD-10-CM | POA: Diagnosis not present

## 2019-05-21 DIAGNOSIS — M179 Osteoarthritis of knee, unspecified: Secondary | ICD-10-CM | POA: Diagnosis not present

## 2019-05-21 DIAGNOSIS — R7303 Prediabetes: Secondary | ICD-10-CM | POA: Diagnosis not present

## 2019-05-21 DIAGNOSIS — I10 Essential (primary) hypertension: Secondary | ICD-10-CM | POA: Diagnosis not present

## 2019-05-21 DIAGNOSIS — M109 Gout, unspecified: Secondary | ICD-10-CM | POA: Diagnosis not present

## 2019-05-21 DIAGNOSIS — Z20828 Contact with and (suspected) exposure to other viral communicable diseases: Secondary | ICD-10-CM | POA: Diagnosis not present

## 2019-07-08 ENCOUNTER — Other Ambulatory Visit: Payer: Self-pay | Admitting: Family Medicine

## 2019-07-08 DIAGNOSIS — M1000 Idiopathic gout, unspecified site: Secondary | ICD-10-CM

## 2019-07-09 NOTE — Telephone Encounter (Signed)
NOV 09/05/19

## 2019-07-17 DIAGNOSIS — Z23 Encounter for immunization: Secondary | ICD-10-CM | POA: Diagnosis not present

## 2019-07-26 ENCOUNTER — Other Ambulatory Visit: Payer: Self-pay | Admitting: Family Medicine

## 2019-08-08 DIAGNOSIS — E7800 Pure hypercholesterolemia, unspecified: Secondary | ICD-10-CM | POA: Diagnosis not present

## 2019-08-08 DIAGNOSIS — K219 Gastro-esophageal reflux disease without esophagitis: Secondary | ICD-10-CM | POA: Diagnosis not present

## 2019-08-08 DIAGNOSIS — M179 Osteoarthritis of knee, unspecified: Secondary | ICD-10-CM | POA: Diagnosis not present

## 2019-08-08 DIAGNOSIS — Z79899 Other long term (current) drug therapy: Secondary | ICD-10-CM | POA: Diagnosis not present

## 2019-08-08 DIAGNOSIS — I10 Essential (primary) hypertension: Secondary | ICD-10-CM | POA: Diagnosis not present

## 2019-08-08 DIAGNOSIS — M109 Gout, unspecified: Secondary | ICD-10-CM | POA: Diagnosis not present

## 2019-08-08 DIAGNOSIS — G459 Transient cerebral ischemic attack, unspecified: Secondary | ICD-10-CM | POA: Diagnosis not present

## 2019-08-13 DIAGNOSIS — Z96651 Presence of right artificial knee joint: Secondary | ICD-10-CM | POA: Diagnosis not present

## 2019-08-22 ENCOUNTER — Other Ambulatory Visit: Payer: Self-pay | Admitting: Family Medicine

## 2019-08-22 DIAGNOSIS — I10 Essential (primary) hypertension: Secondary | ICD-10-CM

## 2019-09-05 ENCOUNTER — Ambulatory Visit: Payer: Medicare Other | Admitting: Family Medicine

## 2019-09-06 ENCOUNTER — Other Ambulatory Visit: Payer: Self-pay | Admitting: Family Medicine

## 2019-09-06 DIAGNOSIS — M1000 Idiopathic gout, unspecified site: Secondary | ICD-10-CM

## 2019-09-06 DIAGNOSIS — M19041 Primary osteoarthritis, right hand: Secondary | ICD-10-CM

## 2019-09-06 DIAGNOSIS — M170 Bilateral primary osteoarthritis of knee: Secondary | ICD-10-CM

## 2019-09-06 DIAGNOSIS — K219 Gastro-esophageal reflux disease without esophagitis: Secondary | ICD-10-CM

## 2019-11-12 DIAGNOSIS — K219 Gastro-esophageal reflux disease without esophagitis: Secondary | ICD-10-CM | POA: Diagnosis not present

## 2019-11-12 DIAGNOSIS — M19049 Primary osteoarthritis, unspecified hand: Secondary | ICD-10-CM | POA: Diagnosis not present

## 2019-11-12 DIAGNOSIS — B977 Papillomavirus as the cause of diseases classified elsewhere: Secondary | ICD-10-CM | POA: Diagnosis not present

## 2019-11-12 DIAGNOSIS — G459 Transient cerebral ischemic attack, unspecified: Secondary | ICD-10-CM | POA: Diagnosis not present

## 2019-11-12 DIAGNOSIS — I10 Essential (primary) hypertension: Secondary | ICD-10-CM | POA: Diagnosis not present

## 2020-05-06 DIAGNOSIS — N5089 Other specified disorders of the male genital organs: Secondary | ICD-10-CM | POA: Diagnosis not present

## 2020-05-08 DIAGNOSIS — R350 Frequency of micturition: Secondary | ICD-10-CM | POA: Diagnosis not present

## 2020-05-08 DIAGNOSIS — N400 Enlarged prostate without lower urinary tract symptoms: Secondary | ICD-10-CM | POA: Diagnosis not present

## 2020-05-08 DIAGNOSIS — N5089 Other specified disorders of the male genital organs: Secondary | ICD-10-CM | POA: Diagnosis not present

## 2020-05-08 DIAGNOSIS — R351 Nocturia: Secondary | ICD-10-CM | POA: Diagnosis not present

## 2020-05-27 DIAGNOSIS — N5089 Other specified disorders of the male genital organs: Secondary | ICD-10-CM | POA: Diagnosis not present

## 2020-05-27 DIAGNOSIS — N503 Cyst of epididymis: Secondary | ICD-10-CM | POA: Diagnosis not present

## 2020-05-27 DIAGNOSIS — N486 Induration penis plastica: Secondary | ICD-10-CM | POA: Insufficient documentation

## 2020-06-23 DIAGNOSIS — N521 Erectile dysfunction due to diseases classified elsewhere: Secondary | ICD-10-CM | POA: Diagnosis not present

## 2020-06-23 DIAGNOSIS — N486 Induration penis plastica: Secondary | ICD-10-CM | POA: Diagnosis not present

## 2020-06-23 DIAGNOSIS — N503 Cyst of epididymis: Secondary | ICD-10-CM | POA: Diagnosis not present

## 2020-07-09 DIAGNOSIS — N529 Male erectile dysfunction, unspecified: Secondary | ICD-10-CM | POA: Diagnosis not present

## 2020-07-09 DIAGNOSIS — N400 Enlarged prostate without lower urinary tract symptoms: Secondary | ICD-10-CM | POA: Diagnosis not present

## 2020-07-09 DIAGNOSIS — N5089 Other specified disorders of the male genital organs: Secondary | ICD-10-CM | POA: Diagnosis not present

## 2020-07-09 DIAGNOSIS — N486 Induration penis plastica: Secondary | ICD-10-CM | POA: Diagnosis not present

## 2020-07-16 DIAGNOSIS — N5089 Other specified disorders of the male genital organs: Secondary | ICD-10-CM | POA: Diagnosis not present

## 2020-07-16 DIAGNOSIS — N433 Hydrocele, unspecified: Secondary | ICD-10-CM | POA: Diagnosis not present

## 2020-07-16 DIAGNOSIS — N486 Induration penis plastica: Secondary | ICD-10-CM | POA: Diagnosis not present

## 2020-07-16 DIAGNOSIS — N503 Cyst of epididymis: Secondary | ICD-10-CM | POA: Diagnosis not present

## 2020-07-16 DIAGNOSIS — N529 Male erectile dysfunction, unspecified: Secondary | ICD-10-CM | POA: Diagnosis not present

## 2020-07-21 DIAGNOSIS — Z23 Encounter for immunization: Secondary | ICD-10-CM | POA: Diagnosis not present

## 2020-07-21 DIAGNOSIS — Z139 Encounter for screening, unspecified: Secondary | ICD-10-CM | POA: Diagnosis not present

## 2020-07-21 DIAGNOSIS — E7800 Pure hypercholesterolemia, unspecified: Secondary | ICD-10-CM | POA: Diagnosis not present

## 2020-07-21 DIAGNOSIS — M1990 Unspecified osteoarthritis, unspecified site: Secondary | ICD-10-CM | POA: Diagnosis not present

## 2020-07-21 DIAGNOSIS — Z833 Family history of diabetes mellitus: Secondary | ICD-10-CM | POA: Diagnosis not present

## 2020-07-21 DIAGNOSIS — Z79899 Other long term (current) drug therapy: Secondary | ICD-10-CM | POA: Diagnosis not present

## 2020-07-21 DIAGNOSIS — I10 Essential (primary) hypertension: Secondary | ICD-10-CM | POA: Diagnosis not present

## 2020-07-27 ENCOUNTER — Encounter: Payer: Self-pay | Admitting: *Deleted

## 2020-08-27 DIAGNOSIS — L570 Actinic keratosis: Secondary | ICD-10-CM | POA: Diagnosis not present

## 2020-08-27 DIAGNOSIS — L814 Other melanin hyperpigmentation: Secondary | ICD-10-CM | POA: Diagnosis not present

## 2020-08-27 DIAGNOSIS — I781 Nevus, non-neoplastic: Secondary | ICD-10-CM | POA: Diagnosis not present

## 2020-08-27 DIAGNOSIS — L813 Cafe au lait spots: Secondary | ICD-10-CM | POA: Diagnosis not present

## 2020-09-14 ENCOUNTER — Other Ambulatory Visit: Payer: Self-pay

## 2020-09-14 ENCOUNTER — Ambulatory Visit (INDEPENDENT_AMBULATORY_CARE_PROVIDER_SITE_OTHER): Payer: Self-pay | Admitting: *Deleted

## 2020-09-14 VITALS — Ht 70.0 in | Wt 183.8 lb

## 2020-09-14 DIAGNOSIS — Z1211 Encounter for screening for malignant neoplasm of colon: Secondary | ICD-10-CM

## 2020-09-14 MED ORDER — NA SULFATE-K SULFATE-MG SULF 17.5-3.13-1.6 GM/177ML PO SOLN: 1.0000 | Freq: Once | ORAL | 0 refills | Status: AC

## 2020-09-14 NOTE — Progress Notes (Signed)
Ok to schedule.  ASA II 

## 2020-09-14 NOTE — Progress Notes (Addendum)
Gastroenterology Pre-Procedure Review  Request Date: 09/14/2020 Requesting Physician: Dr. Leonie Douglas @ [facility], Pt says last TCS 18 years ago at [facility], no polyps per pt, Sent records release to [facility] but was called and informed by [facility] that no reports on file there  PATIENT REVIEW QUESTIONS: The patient responded to the following health history questions as indicated:    1. Diabetes Melitis: no 2. Joint replacements in the past 12 months: no 3. Major health problems in the past 3 months: no 4. Has an artificial valve or MVP: no 5. Has a defibrillator: no 6. Has been advised in past to take antibiotics in advance of a procedure like teeth cleaning: yes, had rt knee replacement 3 years ago 7. Family history of colon cancer: no  8. Alcohol Use: yes, 1 to 2 beers a week 9. Illicit drug Use: no 10. History of sleep apnea: no  11. History of coronary artery or other vascular stents placed within the last 12 months: no 12. History of any prior anesthesia complications: no 13. Body mass index is 26.37 kg/m.    MEDICATIONS & ALLERGIES:    Patient reports the following regarding taking any blood thinners:   Plavix? no Aspirin? yes Coumadin? no Brilinta? no Xarelto? no Eliquis? no Pradaxa? no Savaysa? no Effient? no  Patient confirms/reports the following medications:  Current Outpatient Medications  Medication Sig Dispense Refill  . acetaminophen (TYLENOL) 325 MG tablet Take 2 tablets (650 mg total) by mouth every 6 (six) hours as needed for mild pain. (Patient taking differently: Take 650 mg by mouth as needed for mild pain. )    . allopurinol (ZYLOPRIM) 300 MG tablet TAKE 1 TABLET(300 MG) BY MOUTH EVERY EVENING 30 tablet 1  . aspirin EC 81 MG tablet Take 81 mg by mouth daily.    . chlorthalidone (HYGROTON) 25 MG tablet Take 1 tablet (25 mg total) by mouth daily. (Needs to be seen before next refill) 30 tablet 0  . desonide (DESOWEN) 0.05 %  cream Apply 1 application topically as needed.     . diclofenac (VOLTAREN) 75 MG EC tablet TAKE 1 TABLET BY MOUTH TWICE DAILY (Patient taking differently: daily. ) 240 tablet 3  . lisinopril (ZESTRIL) 5 MG tablet daily.    . pantoprazole (PROTONIX) 40 MG tablet TAKE 1 TABLET BY MOUTH DAILY AFTER DINNER 90 tablet 3  . simvastatin (ZOCOR) 20 MG tablet TAKE 1 TABLET BY MOUTH DAILY AT 6 PM(PLEASE MAKE 6 MONTHS CHECK-UP WITH NEXT 30 DAYS) 30 tablet 0   No current facility-administered medications for this visit.    Patient confirms/reports the following allergies:  Allergies  Allergen Reactions  . Indomethacin     Disoriented    No orders of the defined types were placed in this encounter.   AUTHORIZATION INFORMATION Primary Insurance: Medicare,  ID #: [id number] Pre-Cert / Auth required: No, not required  Secondary Insurance: [place],  [place] #: [id number],  Group #: Plan [id number] Pre-Cert / Auth required: No, not required   SCHEDULE INFORMATION: Procedure has been scheduled as follows:  Date: 11/23/2020, Time: 12:00 Location: [facility] with Dr. Abbey Chatters  This Gastroenterology Pre-Precedure Review Form is being routed to the following provider(s): Aliene Altes, PA

## 2020-09-14 NOTE — Progress Notes (Signed)
Had ATI 2 years ago.

## 2020-09-14 NOTE — Patient Instructions (Addendum)
[place]  [birth year] MRN: [id number]     Procedure Date: 12/21/2020 Time to register: 8:15 am Place to register: [facility] Procedure Time: 9:45 am Scheduled provider:  Dr. Abbey Chatters    PREPARATION FOR COLONOSCOPY WITH SUPREP BOWEL PREP KIT  Note: Suprep Bowel Prep Kit is a split-dose (2day) regimen. Consumption of BOTH 6-ounce bottles is required for a complete prep.  Please notify us immediately if you are diabetic, take iron supplements, or if you are on Coumadin or any other blood thinners.  Please hold the following medications: n/a                                                                                                                                                  2 DAYS BEFORE PROCEDURE:  DATE: 12/19/2020   DAY: Saturday Begin clear liquid diet AFTER your lunch meal. NO SOLID FOODS after this point.  1 DAY BEFORE PROCEDURE:  DATE: 12/20/2020  DAY: Sunday Continue clear liquids the entire day - NO SOLID FOOD.   Diabetic medications adjustments for today: n/a  At 6:00pm: Complete steps 1 through 4 below, using ONE (1) 6-ounce bottle, before going to bed. Step 1:  Pour ONE (1) 6-ounce bottle of SUPREP liquid into the mixing container.  Step 2:  Add cool drinking water to the 16 ounce line on the container and mix.  Note: Dilute the solution concentrate as directed prior to use. Step 3:  DRINK ALL the liquid in the container. Step 4:  You MUST drink an additional two (2) or more 16 ounce containers of water over the next one (1) hour.   Continue clear liquids.  DAY OF PROCEDURE:   DATE: 12/21/2020   DAY: Monday If you take medications for your heart, blood pressure, or breathing, you may take these medications.  Diabetic medications adjustments for today: n/a  5 hours before your procedure at :  4:45 am Step 1:  Pour ONE (1) 6-ounce bottle of SUPREP liquid into the mixing container.  Step 2:  Add cool drinking water to the 16 ounce line on the  container and mix.  Note: Dilute the solution concentrate as directed prior to use. Step 3:  DRINK ALL the liquid in the container. Step 4:  You MUST drink an additional two (2) or more 16 ounce containers of water over the next one (1) hour. You MUST complete the final glass of water at least 3 hours before your colonoscopy. Nothing by mouth past 6:45 am.  You may take your morning medications with sip of water unless we have instructed otherwise.    Please see below for Dietary Information.  CLEAR LIQUIDS INCLUDE:  Water Jello (NOT red in color)   Ice Popsicles (NOT red in color)   Tea (sugar ok, no milk/cream) Powdered fruit flavored drinks  Coffee (sugar  ok, no milk/cream) Gatorade/ Lemonade/ Kool-Aid  (NOT red in color)   Juice: apple, white grape, white cranberry Soft drinks  Clear bullion, consomme, broth (fat free beef/chicken/vegetable)  Carbonated beverages (any kind)  Strained chicken noodle soup Hard Candy   Remember: Clear liquids are liquids that will allow you to see your fingers on the other side of a clear glass. Be sure liquids are NOT red in color, and not cloudy, but CLEAR.  DO NOT EAT OR DRINK ANY OF THE FOLLOWING:  Dairy products of any kind   Cranberry juice Tomato juice / V8 juice   Grapefruit juice Orange juice     Red grape juice  Do not eat any solid foods, including such foods as: cereal, oatmeal, yogurt, fruits, vegetables, creamed soups, eggs, bread, crackers, pureed foods in a blender, etc.   HELPFUL HINTS FOR DRINKING PREP SOLUTION:   Make sure prep is extremely cold. Mix and refrigerate the the morning of the prep. You may also put in the freezer.   You may try mixing some Crystal Light or Country Time Lemonade if you prefer. Mix in small amounts; add more if necessary.  Try drinking through a straw  Rinse mouth with water or a mouthwash between glasses, to remove after-taste.  Try sipping on a cold beverage /ice/ popsicles between glasses of  prep.  Place a piece of sugar-free hard candy in mouth between glasses.  If you become nauseated, try consuming smaller amounts, or stretch out the time between glasses. Stop for 30-60 minutes, then slowly start back drinking.     OTHER INSTRUCTIONS  You will need a responsible adult at least 69 years of age to accompany you and drive you home. This person must remain in the waiting room during your procedure. The hospital will cancel your procedure if you do not have a responsible adult with you.   1. Wear loose fitting clothing that is easily removed. 2. Leave jewelry and other valuables at home.  3. Remove all body piercing jewelry and leave at home. 4. Total time from sign-in until discharge is approximately 2-3 hours. 5. You should go home directly after your procedure and rest. You can resume normal activities the day after your procedure. 6. The day of your procedure you should not:  Drive  Make legal decisions  Operate machinery  Drink alcohol  Return to work   You may call the office (Dept: [phone]) before 5:00pm, or page the doctor on call [phone]) after 5:00pm, for further instructions, if necessary.   Insurance Information YOU WILL NEED TO CHECK WITH YOUR INSURANCE COMPANY FOR THE BENEFITS OF COVERAGE YOU HAVE FOR THIS PROCEDURE.  UNFORTUNATELY, NOT ALL INSURANCE COMPANIES HAVE BENEFITS TO COVER ALL OR PART OF THESE TYPES OF PROCEDURES.  IT IS YOUR RESPONSIBILITY TO CHECK YOUR BENEFITS, HOWEVER, WE WILL BE GLAD TO ASSIST YOU WITH ANY CODES YOUR INSURANCE COMPANY MAY NEED.    PLEASE NOTE THAT MOST INSURANCE COMPANIES WILL NOT COVER A SCREENING COLONOSCOPY FOR PEOPLE UNDER THE AGE OF 50  IF YOU HAVE BCBS INSURANCE, YOU MAY HAVE BENEFITS FOR A SCREENING COLONOSCOPY BUT IF POLYPS ARE FOUND THE DIAGNOSIS WILL CHANGE AND THEN YOU MAY HAVE A DEDUCTIBLE THAT WILL NEED TO BE MET. SO PLEASE MAKE SURE YOU CHECK YOUR BENEFITS FOR A SCREENING COLONOSCOPY AS WELL AS A  DIAGNOSTIC COLONOSCOPY.

## 2020-10-31 DIAGNOSIS — M4150 Other secondary scoliosis, site unspecified: Secondary | ICD-10-CM

## 2020-10-31 HISTORY — DX: Other secondary scoliosis, site unspecified: M41.50

## 2020-11-20 ENCOUNTER — Other Ambulatory Visit (HOSPITAL_COMMUNITY)
Admission: RE | Admit: 2020-11-20 | Discharge: 2020-11-20 | Disposition: A | Discharge status: Home / Self Care | Payer: Medicare Other | Source: Ambulatory Visit | Attending: Internal Medicine | Admitting: Internal Medicine

## 2020-11-20 NOTE — Progress Notes (Signed)
Called patient, he's not sure he can get out of his driveway. Dr.'s office is closed. Patient is going to call them again and leave a message. Saying he needs to reschedule his procedure.

## 2020-11-23 ENCOUNTER — Telehealth: Payer: Self-pay

## 2020-11-23 NOTE — Telephone Encounter (Signed)
VM received from 11/20/20. Pt didn't get his covid test and didn't prepare for his TCS today 1/24/222. Pt would like a return call. Our office was closed on 11/20/20 when pt called.

## 2020-11-24 NOTE — Telephone Encounter (Signed)
Lmom for pt to call us back. 

## 2020-11-24 NOTE — Telephone Encounter (Addendum)
Spoke to pt.  He informed me that he was unable to do Covid screening and procedure due to being snowed/ iced in.  He was unable to get out of his driveway.  Pt rescheduled his Covid screening to 12/11/2020 at 10:10.  Pt rescheduled his procedure to 12/14/2020 at 11:00 with arrival at 9:30.  Informed Hoyle Sauer in Endo by voice mail.  Pt aware that I am mailing out prep instructions.

## 2020-12-08 ENCOUNTER — Telehealth: Payer: Self-pay | Admitting: Internal Medicine

## 2020-12-08 NOTE — Telephone Encounter (Signed)
Pt wants to reschedule his colonoscopy on 2/14 with Dr Abbey Chatters. Please call [phone]

## 2020-12-08 NOTE — Telephone Encounter (Signed)
Spoke to pt.  He rescheduled his Covid screening to 12/18/2020 at 3:00.  Rescheduled procedure to 12/21/2020 at 9:45 with arrival at 8:15.  Pt made aware that I will fax his new prep instructions to [facility] in [place].  I will also mail out a copy.  Pt voiced understanding.  Hoyle Sauer in Endo made aware of new procedure date and time.

## 2020-12-11 ENCOUNTER — Other Ambulatory Visit (HOSPITAL_COMMUNITY): Payer: Medicare Other

## 2020-12-18 ENCOUNTER — Other Ambulatory Visit (HOSPITAL_COMMUNITY)
Admission: RE | Admit: 2020-12-18 | Discharge: 2020-12-18 | Disposition: A | Discharge status: Home / Self Care | Payer: Medicare Other | Source: Ambulatory Visit | Attending: Internal Medicine | Admitting: Internal Medicine

## 2020-12-18 ENCOUNTER — Encounter (HOSPITAL_COMMUNITY): Payer: Self-pay

## 2020-12-18 ENCOUNTER — Other Ambulatory Visit: Payer: Self-pay

## 2020-12-18 DIAGNOSIS — Z01812 Encounter for preprocedural laboratory examination: Secondary | ICD-10-CM | POA: Insufficient documentation

## 2020-12-18 DIAGNOSIS — Z20822 Contact with and (suspected) exposure to covid-19: Secondary | ICD-10-CM | POA: Insufficient documentation

## 2020-12-19 LAB — SARS CORONAVIRUS 2 (TAT 6-24 HRS): SARS Coronavirus 2: NEGATIVE

## 2020-12-21 ENCOUNTER — Encounter (HOSPITAL_COMMUNITY)
Admission: RE | Disposition: A | Discharge status: Home / Self Care | Payer: Self-pay | Source: Home / Self Care | Attending: Internal Medicine

## 2020-12-21 ENCOUNTER — Ambulatory Visit (HOSPITAL_COMMUNITY): Payer: Medicare Other | Admitting: Anesthesiology

## 2020-12-21 ENCOUNTER — Ambulatory Visit (HOSPITAL_COMMUNITY)
Admission: RE | Admit: 2020-12-21 | Discharge: 2020-12-21 | Disposition: A | Discharge status: Home / Self Care | Payer: Medicare Other | Attending: Internal Medicine | Admitting: Internal Medicine

## 2020-12-21 ENCOUNTER — Other Ambulatory Visit: Payer: Self-pay

## 2020-12-21 ENCOUNTER — Encounter (HOSPITAL_COMMUNITY): Payer: Self-pay | Admitting: *Deleted

## 2020-12-21 DIAGNOSIS — Z833 Family history of diabetes mellitus: Secondary | ICD-10-CM | POA: Insufficient documentation

## 2020-12-21 DIAGNOSIS — K5730 Diverticulosis of large intestine without perforation or abscess without bleeding: Secondary | ICD-10-CM | POA: Diagnosis not present

## 2020-12-21 DIAGNOSIS — K648 Other hemorrhoids: Secondary | ICD-10-CM | POA: Insufficient documentation

## 2020-12-21 DIAGNOSIS — Z1211 Encounter for screening for malignant neoplasm of colon: Secondary | ICD-10-CM | POA: Diagnosis not present

## 2020-12-21 DIAGNOSIS — Z8249 Family history of ischemic heart disease and other diseases of the circulatory system: Secondary | ICD-10-CM | POA: Diagnosis not present

## 2020-12-21 DIAGNOSIS — D125 Benign neoplasm of sigmoid colon: Secondary | ICD-10-CM | POA: Insufficient documentation

## 2020-12-21 DIAGNOSIS — D120 Benign neoplasm of cecum: Secondary | ICD-10-CM | POA: Diagnosis not present

## 2020-12-21 DIAGNOSIS — Z888 Allergy status to other drugs, medicaments and biological substances status: Secondary | ICD-10-CM | POA: Insufficient documentation

## 2020-12-21 DIAGNOSIS — Z7982 Long term (current) use of aspirin: Secondary | ICD-10-CM | POA: Diagnosis not present

## 2020-12-21 DIAGNOSIS — E785 Hyperlipidemia, unspecified: Secondary | ICD-10-CM | POA: Diagnosis not present

## 2020-12-21 DIAGNOSIS — Z791 Long term (current) use of non-steroidal anti-inflammatories (NSAID): Secondary | ICD-10-CM | POA: Diagnosis not present

## 2020-12-21 DIAGNOSIS — K635 Polyp of colon: Secondary | ICD-10-CM | POA: Diagnosis not present

## 2020-12-21 DIAGNOSIS — K6389 Other specified diseases of intestine: Secondary | ICD-10-CM | POA: Diagnosis not present

## 2020-12-21 DIAGNOSIS — Z8572 Personal history of non-Hodgkin lymphomas: Secondary | ICD-10-CM | POA: Diagnosis not present

## 2020-12-21 HISTORY — PX: COLONOSCOPY WITH PROPOFOL: SHX5780

## 2020-12-21 HISTORY — PX: BIOPSY: SHX5522

## 2020-12-21 HISTORY — PX: POLYPECTOMY: SHX149

## 2020-12-21 SURGERY — COLONOSCOPY WITH PROPOFOL
Anesthesia: General

## 2020-12-21 MED ORDER — CHLORHEXIDINE GLUCONATE CLOTH 2 % EX PADS: 6.0000 | MEDICATED_PAD | Freq: Once | CUTANEOUS | Status: DC

## 2020-12-21 MED ORDER — PROPOFOL 10 MG/ML IV BOLUS
INTRAVENOUS | Status: DC | PRN
  Administered 2020-12-21: 30 mg via INTRAVENOUS
  Administered 2020-12-21: 40 mg via INTRAVENOUS
  Administered 2020-12-21: 130 mg via INTRAVENOUS
  Administered 2020-12-21: 30 mg via INTRAVENOUS
  Administered 2020-12-21: 40 mg via INTRAVENOUS

## 2020-12-21 MED ORDER — LIDOCAINE HCL (CARDIAC) PF 100 MG/5ML IV SOSY
PREFILLED_SYRINGE | INTRAVENOUS | Status: DC | PRN
  Administered 2020-12-21: 50 mg via INTRAVENOUS

## 2020-12-21 MED ORDER — STERILE WATER FOR IRRIGATION IR SOLN
Status: DC | PRN
  Administered 2020-12-21: 200 mL

## 2020-12-21 MED ORDER — LACTATED RINGERS IV SOLN
INTRAVENOUS | Status: DC
Start: 2020-12-21 — End: 2020-12-21

## 2020-12-21 MED ORDER — LACTATED RINGERS IV SOLN: INTRAVENOUS | Status: DC

## 2020-12-21 MED ORDER — LACTATED RINGERS IV SOLN: INTRAVENOUS | Status: DC | PRN

## 2020-12-21 NOTE — Anesthesia Preprocedure Evaluation (Signed)
Anesthesia Evaluation  Patient identified by MRN, date of birth, ID band Patient awake    Reviewed: Allergy & Precautions, H&P , NPO status , Patient's Chart, lab work & pertinent test results, reviewed documented beta blocker date and time   Airway Mallampati: II  TM Distance: >3 FB Neck ROM: full    Dental no notable dental hx. (+) Teeth Intact   Pulmonary neg pulmonary ROS,    Pulmonary exam normal breath sounds clear to auscultation       Cardiovascular Exercise Tolerance: Good negative cardio ROS   Rhythm:regular Rate:Normal     Neuro/Psych negative neurological ROS  negative psych ROS   GI/Hepatic Neg liver ROS, GERD  Medicated,  Endo/Other  negative endocrine ROS  Renal/GU negative Renal ROS  negative genitourinary   Musculoskeletal   Abdominal   Peds  Hematology negative hematology ROS (+)   Anesthesia Other Findings   Reproductive/Obstetrics negative OB ROS                             Anesthesia Physical Anesthesia Plan  ASA: II  Anesthesia Plan: General   Post-op Pain Management:    Induction:   PONV Risk Score and Plan: Propofol infusion  Airway Management Planned:   Additional Equipment:   Intra-op Plan:   Post-operative Plan:   Informed Consent: I have reviewed the patients History and Physical, chart, labs and discussed the procedure including the risks, benefits and alternatives for the proposed anesthesia with the patient or authorized representative who has indicated his/her understanding and acceptance.     Dental Advisory Given  Plan Discussed with: CRNA  Anesthesia Plan Comments:         Anesthesia Quick Evaluation

## 2020-12-21 NOTE — Anesthesia Procedure Notes (Signed)
Date/Time: 12/21/2020 9:16 AM Performed by: Orlie Dakin, CRNA Pre-anesthesia Checklist: Patient identified, Emergency Drugs available, Suction available and Patient being monitored Patient Re-evaluated:Patient Re-evaluated prior to induction Oxygen Delivery Method: Nasal cannula Induction Type: IV induction Placement Confirmation: positive ETCO2

## 2020-12-21 NOTE — Transfer of Care (Signed)
Immediate Anesthesia Transfer of Care Note  Patient: [name]  Procedure(s) Performed: COLONOSCOPY WITH PROPOFOL (N/A ) POLYPECTOMY INTESTINAL BIOPSY  Patient Location: Endoscopy Unit  Anesthesia Type:General  Level of Consciousness: awake and oriented  Airway & Oxygen Therapy: Patient Spontanous Breathing  Post-op Assessment: Report given to RN and Post -op Vital signs reviewed and stable  Post vital signs: Reviewed and stable  Last Vitals:  Vitals Value Taken Time  BP    Temp    Pulse    Resp    SpO2      Last Pain:  Vitals:   12/21/20 0913  TempSrc:   PainSc: 0-No pain      Patients Stated Pain Goal: 6 (20/23/34 3568)  Complications: No complications documented.

## 2020-12-21 NOTE — Op Note (Signed)
[facility] Patient Name: [name] Procedure Date: 12/21/2020 9:00 AM MRN: [id number] Date of Birth: 04/12/1951 Attending MD: Elon Alas. Abbey Chatters DO CSN: [id number] Age: 70 Admit Type: Outpatient Procedure:                Colonoscopy Indications:              Screening for colorectal malignant neoplasm Providers:                Elon Alas. Abbey Chatters, DO, [place] Page, [place]                            Risa Grill, Technician Referring MD:              Medicines:                See the Anesthesia note for documentation of the                            administered medications Complications:            No immediate complications. Estimated Blood Loss:     Estimated blood loss was minimal. Procedure:                Pre-Anesthesia Assessment:                           - The anesthesia plan was to use monitored                            anesthesia care (MAC).                           After obtaining informed consent, the colonoscope                            was passed under direct vision. Throughout the                            procedure, the patient's blood pressure, pulse, and                            oxygen saturations were monitored continuously. The                            [id number]-H190DL ([id number]) scope was introduced through                            the anus and advanced to the the cecum, identified                            by appendiceal orifice and ileocecal valve. The                            colonoscopy was performed without difficulty. The                            patient tolerated the procedure well.  The quality                            of the bowel preparation was evaluated using the                            BBPS [facility] Bowel Preparation Scale) with scores                            of: Right Colon = 3, Transverse Colon = 3 and Left                            Colon = 3 (entire mucosa seen well with no residual                             staining, small fragments of stool or opaque                            liquid). The total BBPS score equals 9. Scope In: 9:18:06 AM Scope Out: 9:32:50 AM Scope Withdrawal Time: 0 hours 11 minutes 17 seconds  Total Procedure Duration: 0 hours 14 minutes 44 seconds  Findings:      The perianal and digital rectal examinations were normal.      Non-bleeding internal hemorrhoids were found during endoscopy.      A few small-mouthed diverticula were found in the sigmoid colon.      A 2 mm polyp was found in the cecum. The polyp was sessile. The polyp       was removed with a jumbo cold forceps. Resection and retrieval were       complete.      A localized area of nodular mucosa was found at the ileocecal valve.       Biopsies were taken with a cold forceps for histology.      A 7 mm polyp was found in the sigmoid colon. The polyp was sessile. The       polyp was removed with a cold snare. Resection and retrieval were       complete.      The terminal ileum appeared normal. Impression:               - Non-bleeding internal hemorrhoids.                           - Diverticulosis in the sigmoid colon.                           - One 2 mm polyp in the cecum, removed with a jumbo                            cold forceps. Resected and retrieved.                           - Nodular mucosa at the ileocecal valve. Biopsied.                           - One 7 mm polyp  in the sigmoid colon, removed with                            a cold snare. Resected and retrieved.                           - The examined portion of the ileum was normal. Moderate Sedation:      Per Anesthesia Care Recommendation:           - Patient has a contact number available for                            emergencies. The signs and symptoms of potential                            delayed complications were discussed with the                            patient. Return to normal activities tomorrow.                             Written discharge instructions were provided to the                            patient.                           - Resume previous diet.                           - Continue present medications.                           - Await pathology results.                           - Repeat colonoscopy in 5 years for surveillance.                           - Return to GI clinic PRN. Procedure Code(s):        --- Professional ---                           [phone], Colonoscopy, flexible; with removal of                            tumor(s), polyp(s), or other lesion(s) by snare                            technique                           45380, 59, Colonoscopy, flexible; with biopsy,                            single or multiple Diagnosis Code(s):        ---  Professional ---                           Z12.11, Encounter for screening for malignant                            neoplasm of colon                           K64.8, Other hemorrhoids                           K63.5, Polyp of colon                           K63.89, Other specified diseases of intestine                           K57.30, Diverticulosis of large intestine without                            perforation or abscess without bleeding CPT copyright 2019 American Medical Association. All rights reserved. The codes documented in this report are preliminary and upon coder review may  be revised to meet current compliance requirements. Elon Alas. Abbey Chatters, DO [place] Abbey Chatters, DO 12/21/2020 9:37:44 AM This report has been signed electronically. Number of Addenda: 0

## 2020-12-21 NOTE — Discharge Instructions (Addendum)
Colonoscopy Discharge Instructions  Read the instructions outlined below and refer to this sheet in the next few weeks. These discharge instructions provide you with general information on caring for yourself after you leave the hospital. Your doctor may also give you specific instructions. While your treatment has been planned according to the most current medical practices available, unavoidable complications occasionally occur.   ACTIVITY  You may resume your regular activity, but move at a slower pace for the next 24 hours.   Take frequent rest periods for the next 24 hours.   Walking will help get rid of the air and reduce the bloated feeling in your belly (abdomen).   No driving for 24 hours (because of the medicine (anesthesia) used during the test).    Do not sign any important legal documents or operate any machinery for 24 hours (because of the anesthesia used during the test).  NUTRITION  Drink plenty of fluids.   You may resume your normal diet as instructed by your doctor.   Begin with a light meal and progress to your normal diet. Heavy or fried foods are harder to digest and may make you feel sick to your stomach (nauseated).   Avoid alcoholic beverages for 24 hours or as instructed.  MEDICATIONS  You may resume your normal medications unless your doctor tells you otherwise.  WHAT YOU CAN EXPECT TODAY  Some feelings of bloating in the abdomen.   Passage of more gas than usual.   Spotting of blood in your stool or on the toilet paper.  IF YOU HAD POLYPS REMOVED DURING THE COLONOSCOPY:  No aspirin products for 7 days or as instructed.   No alcohol for 7 days or as instructed.   Eat a soft diet for the next 24 hours.  FINDING OUT THE RESULTS OF YOUR TEST Not all test results are available during your visit. If your test results are not back during the visit, make an appointment with your caregiver to find out the results. Do not assume everything is normal if  you have not heard from your caregiver or the medical facility. It is important for you to follow up on all of your test results.  SEEK IMMEDIATE MEDICAL ATTENTION IF:  You have more than a spotting of blood in your stool.   Your belly is swollen (abdominal distention).   You are nauseated or vomiting.   You have a temperature over 101.   You have abdominal pain or discomfort that is severe or gets worse throughout the day.   Your colonoscopy revealed 2 polyp(s) which I removed successfully. Await pathology results, my office will contact you. I recommend repeating colonoscopy in 5 years for surveillance purposes.  The ileocecal valve which connects your small bowel to your colon appeared slightly nodular.  This is most likely benign but I did take biopsies of it.  We will call you with these results by the end of the week.  Follow-up with GI as needed.   I hope you have a great rest of your week!  Elon Alas. Abbey Chatters, D.O. Gastroenterology and Hepatology [facility]      Colon Polyps  Colon polyps are tissue growths inside the colon, which is part of the large intestine. They are one of the types of polyps that can grow in the body. A polyp may be a round bump or a mushroom-shaped growth. You could have one polyp or more than one. Most colon polyps are noncancerous (benign). However, some  colon polyps can become cancerous over time. Finding and removing the polyps early can help prevent this. What are the causes? The exact cause of colon polyps is not known. What increases the risk? The following factors may make you more likely to develop this condition:  Having a family history of colorectal cancer or colon polyps.  Being older than 70 years of age.  Being younger than 70 years of age and having a significant family history of colorectal cancer or colon polyps or a genetic condition that puts you at higher risk of getting colon polyps.  Having  inflammatory bowel disease, such as ulcerative colitis or Crohn's disease.  Having certain conditions passed from parent to child (hereditary conditions), such as: ? Familial adenomatous polyposis (FAP). ? Lynch syndrome. ? Turcot syndrome. ? Peutz-Jeghers syndrome. ? MUTYH-associated polyposis (MAP).  Being overweight.  Certain lifestyle factors. These include smoking cigarettes, drinking too much alcohol, not getting enough exercise, and eating a diet that is high in fat and red meat and low in fiber.  Having had childhood cancer that was treated with radiation of the abdomen. What are the signs or symptoms? Many times, there are no symptoms. If you have symptoms, they may include:  Blood coming from the rectum during a bowel movement.  Blood in the stool (feces). The blood may be bright red or very dark in color.  Pain in the abdomen.  A change in bowel habits, such as constipation or diarrhea. How is this diagnosed? This condition is diagnosed with a colonoscopy. This is a procedure in which a lighted, flexible scope is inserted into the opening between the buttocks (anus) and then passed into the colon to examine the area. Polyps are sometimes found when a colonoscopy is done as part of routine cancer screening tests. How is this treated? This condition is treated by removing any polyps that are found. Most polyps can be removed during a colonoscopy. Those polyps will then be tested for cancer. Additional treatment may be needed depending on the results of testing. Follow these instructions at home: Eating and drinking  Eat foods that are high in fiber, such as fruits, vegetables, and whole grains.  Eat foods that are high in calcium and vitamin D, such as milk, cheese, yogurt, eggs, liver, fish, and broccoli.  Limit foods that are high in fat, such as fried foods and desserts.  Limit the amount of red meat, precooked or cured meat, or other processed meat that you eat,  such as hot dogs, sausages, bacon, or meat loaves.  Limit sugary drinks.   Lifestyle  Maintain a healthy weight, or lose weight if recommended by your health care provider.  Exercise every day or as told by your health care provider.  Do not use any products that contain nicotine or tobacco, such as cigarettes, e-cigarettes, and chewing tobacco. If you need help quitting, ask your health care provider.  Do not drink alcohol if: ? Your health care provider tells you not to drink. ? You are pregnant, may be pregnant, or are planning to become pregnant.  If you drink alcohol: ? Limit how much you use to:  0-1 drink a day for women.  0-2 drinks a day for men. ? Know how much alcohol is in your drink. In the [place] one drink equals one 12 oz bottle of beer (355 mL), one 5 oz glass of wine (148 mL), or one 1 oz glass of hard liquor (44 mL). General instructions  Take over-the-counter  and prescription medicines only as told by your health care provider.  Keep all follow-up visits. This is important. This includes having regularly scheduled colonoscopies. Talk to your health care provider about when you need a colonoscopy. Contact a health care provider if:  You have new or worsening bleeding during a bowel movement.  You have new or increased blood in your stool.  You have a change in bowel habits.  You lose weight for no known reason. Summary  Colon polyps are tissue growths inside the colon, which is part of the large intestine. They are one type of polyp that can grow in the body.  Most colon polyps are noncancerous (benign), but some can become cancerous over time.  This condition is diagnosed with a colonoscopy.  This condition is treated by removing any polyps that are found. Most polyps can be removed during a colonoscopy. This information is not intended to replace advice given to you by your health care provider. Make sure you discuss any questions you have with your  health care provider. Document Revised: 02/05/2020 Document Reviewed: 02/05/2020 Elsevier Patient Education  2021 [employer].     Diverticulosis  Diverticulosis is a condition that develops when small pouches (diverticula) form in the wall of the large intestine (colon). The colon is where water is absorbed and stool (feces) is formed. The pouches form when the inside layer of the colon pushes through weak spots in the outer layers of the colon. You may have a few pouches or many of them. The pouches usually do not cause problems unless they become inflamed or infected. When this happens, the condition is called diverticulitis. What are the causes? The cause of this condition is not known. What increases the risk? The following factors may make you more likely to develop this condition:  Being older than age 44. Your risk for this condition increases with age. Diverticulosis is rare among people younger than age 45. By age 64, many people have it.  Eating a low-fiber diet.  Having frequent constipation.  Being overweight.  Not getting enough exercise.  Smoking.  Taking over-the-counter pain medicines, like aspirin and ibuprofen.  Having a family history of diverticulosis. What are the signs or symptoms? In most people, there are no symptoms of this condition. If you do have symptoms, they may include:  Bloating.  Cramps in the abdomen.  Constipation or diarrhea.  Pain in the lower left side of the abdomen. How is this diagnosed? Because diverticulosis usually has no symptoms, it is most often diagnosed during an exam for other colon problems. The condition may be diagnosed by:  Using a flexible scope to examine the colon (colonoscopy).  Taking an X-ray of the colon after dye has been put into the colon (barium enema).  Having a CT scan. How is this treated? You may not need treatment for this condition. Your health care provider may recommend treatment to prevent  problems. You may need treatment if you have symptoms or if you previously had diverticulitis. Treatment may include:  Eating a high-fiber diet.  Taking a fiber supplement.  Taking a live bacteria supplement (probiotic).  Taking medicine to relax your colon.   Follow these instructions at home: Medicines  Take over-the-counter and prescription medicines only as told by your health care provider.  If told by your health care provider, take a fiber supplement or probiotic. Constipation prevention Your condition may cause constipation. To prevent or treat constipation, you may need to:  Drink enough  fluid to keep your urine pale yellow.  Take over-the-counter or prescription medicines.  Eat foods that are high in fiber, such as beans, whole grains, and fresh fruits and vegetables.  Limit foods that are high in fat and processed sugars, such as fried or sweet foods.   General instructions  Try not to strain when you have a bowel movement.  Keep all follow-up visits as told by your health care provider. This is important. Contact a health care provider if you:  Have pain in your abdomen.  Have bloating.  Have cramps.  Have not had a bowel movement in 3 days. Get help right away if:  Your pain gets worse.  Your bloating becomes very bad.  You have a fever or chills, and your symptoms suddenly get worse.  You vomit.  You have bowel movements that are bloody or black.  You have bleeding from your rectum. Summary  Diverticulosis is a condition that develops when small pouches (diverticula) form in the wall of the large intestine (colon).  You may have a few pouches or many of them.  This condition is most often diagnosed during an exam for other colon problems.  Treatment may include increasing the fiber in your diet, taking supplements, or taking medicines. This information is not intended to replace advice given to you by your health care provider. Make sure you  discuss any questions you have with your health care provider. Document Revised: 05/16/2019 Document Reviewed: 05/16/2019 Elsevier Patient Education  2021 [employer] This sheet gives you information about how to care for yourself after your procedure. Your health care provider may also give you more specific instructions. If you have problems or questions, contact your health care provider. What can I expect after the procedure? After the procedure, it is common to have:  Tiredness.  Forgetfulness about what happened after the procedure.  Impaired judgment for important decisions.  Nausea or vomiting.  Some difficulty with balance. Follow these instructions at home: For the time period you were told by your health care provider:  Rest as needed.  Do not participate in activities where you could fall or become injured.  Do not drive or use machinery.  Do not drink alcohol.  Do not take sleeping pills or medicines that cause drowsiness.  Do not make important decisions or sign legal documents.  Do not take care of children on your own.      Eating and drinking  Follow the diet that is recommended by your health care provider.  Drink enough fluid to keep your urine pale yellow.  If you vomit: ? Drink water, juice, or soup when you can drink without vomiting. ? Make sure you have little or no nausea before eating solid foods. General instructions  Have a responsible adult stay with you for the time you are told. It is important to have someone help care for you until you are awake and alert.  Take over-the-counter and prescription medicines only as told by your health care provider.  If you have sleep apnea, surgery and certain medicines can increase your risk for breathing problems. Follow instructions from your health care provider about wearing your sleep device: ? Anytime you are sleeping, including during daytime  naps. ? While taking prescription pain medicines, sleeping medicines, or medicines that make you drowsy.  Avoid smoking.  Keep all follow-up visits as told by your health care provider. This is important. Contact a  health care provider if:  You keep feeling nauseous or you keep vomiting.  You feel light-headed.  You are still sleepy or having trouble with balance after 24 hours.  You develop a rash.  You have a fever.  You have redness or swelling around the IV site. Get help right away if:  You have trouble breathing.  You have new-onset confusion at home. Summary  For several hours after your procedure, you may feel tired. You may also be forgetful and have poor judgment.  Have a responsible adult stay with you for the time you are told. It is important to have someone help care for you until you are awake and alert.  Rest as told. Do not drive or operate machinery. Do not drink alcohol or take sleeping pills.  Get help right away if you have trouble breathing, or if you suddenly become confused. This information is not intended to replace advice given to you by your health care provider. Make sure you discuss any questions you have with your health care provider. Document Revised: 07/02/2020 Document Reviewed: 09/19/2019 Elsevier Patient Education  2021 [employer].

## 2020-12-21 NOTE — H&P (Addendum)
Primary Care Physician:  Leonie Douglas, MD Primary Gastroenterologist:  Dr. Abbey Chatters  Pre-Procedure History & Physical: HPI:  [name] is a 70 y.o. male is here for a colonoscopy for colon cancer screening purposes.  Patient denies any family history of colorectal cancer.  No melena or hematochezia.  No abdominal pain or unintentional weight loss.  No change in bowel habits.  Overall feels well from a GI standpoint.  Past Medical History:  Diagnosis Date  . Arthritis    "knees, fingers, elbows, toes" (02/19/2018)  . Dyslipidemia   . GERD (gastroesophageal reflux disease)   . Gout    "take RX daily" (02/19/2018)  . 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 Primary localized osteoarthrosis of the knee, right   . Tinnitus of left ear     Past Surgical History:  Procedure Laterality Date  . COLONOSCOPY    . JOINT REPLACEMENT    . KNEE ARTHROSCOPY Right 10/31/06  . MOUTH SURGERY     "related to replacing crown; reduced the pedistal"  . TOE SURGERY Bilateral    Great toe each foot from athletic injuries; "ground the top of the bone off"  . 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 TOTAL KNEE ARTHROPLASTY Right 02/19/2018   Procedure: TOTAL KNEE ARTHROPLASTY;  Surgeon: Elsie Saas, MD;  Location: [place];  Service: Orthopedics;  Laterality: Right;    Prior to Admission medications   Medication Sig Start Date End Date Taking? Authorizing Provider  allopurinol (ZYLOPRIM) 300 MG tablet TAKE 1 TABLET(300 MG) BY MOUTH EVERY EVENING Patient taking differently: Take 300 mg by mouth daily. 07/09/19  Yes Rakes, Connye Burkitt, FNP  aspirin EC 81 MG tablet Take 81 mg by mouth at bedtime.   Yes [provider]  chlorthalidone (HYGROTON) 25 MG tablet Take 1 tablet (25 mg total) by mouth daily. (Needs to be seen before next refill) 08/22/19  Yes Rakes, Connye Burkitt, FNP  desonide (DESOWEN) 0.05 % cream Apply 1 application topically daily as needed  (irritation).   Yes [provider]  diclofenac (VOLTAREN) 75 MG EC tablet TAKE 1 TABLET BY MOUTH TWICE DAILY Patient taking differently: Take 75 mg by mouth daily. 09/06/19  Yes Rakes, Connye Burkitt, FNP  lisinopril (ZESTRIL) 5 MG tablet Take 5 mg by mouth daily. 04/22/20  Yes [provider]  pantoprazole (PROTONIX) 40 MG tablet TAKE 1 TABLET BY MOUTH DAILY AFTER DINNER Patient taking differently: Take 40 mg by mouth daily. TAKE 1 TABLET BY MOUTH  DAILY AFTER DINNER 09/06/19  Yes Rakes, Connye Burkitt, FNP  simvastatin (ZOCOR) 20 MG tablet TAKE 1 TABLET BY MOUTH DAILY AT 6 PM(PLEASE MAKE 6 MONTHS CHECK-UP WITH NEXT 30 DAYS) Patient taking differently: Take 20 mg by mouth daily at 6 PM. 07/26/19  Yes Rakes, Connye Burkitt, FNP  acetaminophen (TYLENOL) 325 MG tablet Take 2 tablets (650 mg total) by mouth every 6 (six) hours as needed for mild pain. 02/20/18   Shepperson, Kirstin, PA-C    Allergies as of 09/14/2020 - Review Complete 09/14/2020  Allergen Reaction Noted  . Indomethacin  05/24/2013    Family History  Problem Relation Age of Onset  . Diabetes Mother   . Congestive Heart Failure Mother   . Diabetes Maternal Grandmother     Social History   Socioeconomic History  . Marital status: Married    Spouse name: Carmela  . Number of children: Not on file  . Years of  education: 16  . Highest education level: Bachelor's degree (e.g., BA, AB, BS)  Occupational History  . Occupation: retired  Tobacco Use  . Smoking status: Never Smoker  . Smokeless tobacco: Never Used  Vaping Use  . Vaping Use: Never used  Substance and Sexual Activity  . Alcohol use: Yes    Alcohol/week: 3.0 standard drinks    Types: 3 Cans of beer per week  . Drug use: No  . Sexual activity: Not Currently  Other Topics Concern  . Not on file  Social History Narrative  . Not on file   Social Determinants of Health   Financial Resource Strain: Not on file  Food Insecurity: Not on file  Transportation  Needs: Not on file  Physical Activity: Not on file  Stress: Not on file  Social Connections: Not on file  Intimate Partner Violence: Not on file    Review of Systems: See HPI, otherwise negative ROS  PE  General:   Alert,  Well-developed, well-nourished, pleasant and cooperative in NAD Head:  Normocephalic and atraumatic. Eyes:  Sclera clear, no icterus.   Conjunctiva pink. Ears:  Normal auditory acuity. Nose:  No deformity, discharge,  or lesions. Mouth:  No deformity or lesions, dentition normal. Neck:  Supple; no masses or thyromegaly. Lungs:  Clear throughout to auscultation.   No wheezes, crackles, or rhonchi. No acute distress. Heart:  Regular rate and rhythm; no murmurs, clicks, rubs,  or gallops. Abdomen:  Soft, nontender and nondistended. No masses, hepatosplenomegaly or hernias noted. Normal bowel sounds, without guarding, and without rebound.   Msk:  Symmetrical without gross deformities. Normal posture. Extremities:  Without clubbing or edema. Neurologic:  Alert and  oriented x4;  grossly normal neurologically. Skin:  Intact without significant lesions or rashes. Cervical Nodes:  No significant cervical adenopathy. Psych:  Alert and cooperative. Normal mood and affect.  Impression/Plan: [name] is here for a colonoscopy to be performed for colon cancer screening purposes.  The risks of the procedure including infection, bleed, or perforation as well as benefits, limitations, alternatives and imponderables have been reviewed with the patient. Questions have been answered. All parties agreeable.

## 2020-12-21 NOTE — Anesthesia Postprocedure Evaluation (Signed)
Anesthesia Post Note  Patient: [name]  Procedure(s) Performed: COLONOSCOPY WITH PROPOFOL (N/A ) POLYPECTOMY INTESTINAL BIOPSY  Patient location during evaluation: Endoscopy Anesthesia Type: General Level of consciousness: awake and alert and oriented Pain management: pain level controlled Vital Signs Assessment: post-procedure vital signs reviewed and stable Respiratory status: spontaneous breathing, nonlabored ventilation and respiratory function stable Cardiovascular status: blood pressure returned to baseline and stable Postop Assessment: no apparent nausea or vomiting Anesthetic complications: no   No complications documented.   Last Vitals:  Vitals:   12/21/20 0936 12/21/20 0938  BP: 96/62 95/62  Pulse: 76 79  Resp: 16 18  Temp: 36.6 C   SpO2: 95% 96%    Last Pain:  Vitals:   12/21/20 0938  TempSrc:   PainSc: 0-No pain                 Orlie Dakin

## 2020-12-22 LAB — SURGICAL PATHOLOGY

## 2020-12-24 ENCOUNTER — Encounter (HOSPITAL_COMMUNITY): Payer: Self-pay | Admitting: Internal Medicine

## 2020-12-25 IMAGING — CT CT HEAD CODE STROKE W/O CM
3 series · 15 of 47 positions shown, 18 images · non-contrast
Comparison: None.

CLINICAL DATA: Code stroke.  Acute confusion upon wakening today.

EXAM:
CT HEAD WITHOUT CONTRAST
TECHNIQUE: Contiguous axial images were obtained from the base of the skull
through the vertex without intravenous contrast.

[Series 2: head code stroke wo · axial · 0.44mm/px · z∈[+98,+233]mm · 9 of 33 slices shown, 12 images]
[im 3/33  brain]
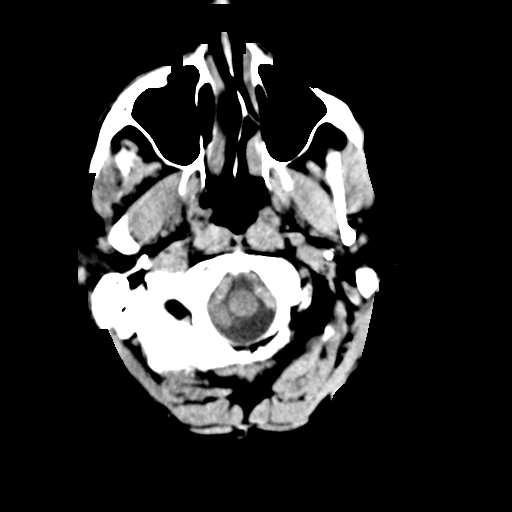
[im 3/33  bone]
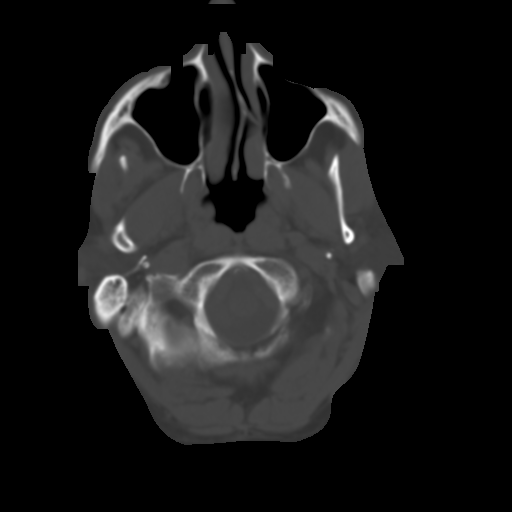
[im 6/33  brain]
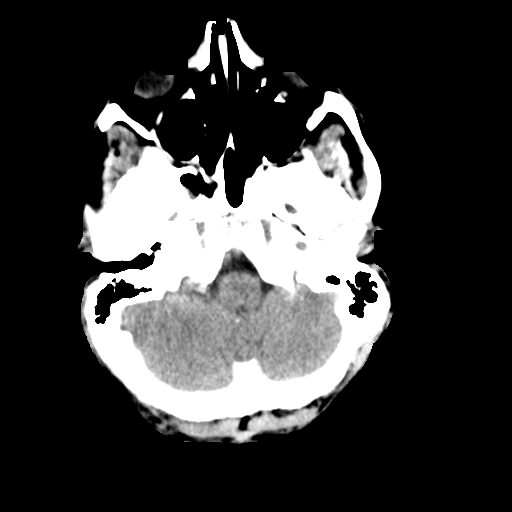
[im 9/33  brain]
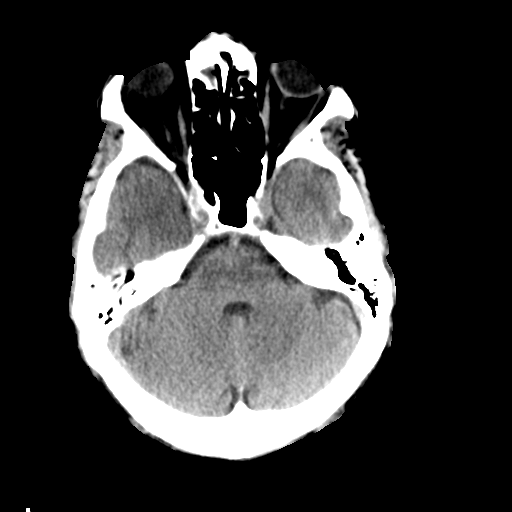
[im 13/33  brain]
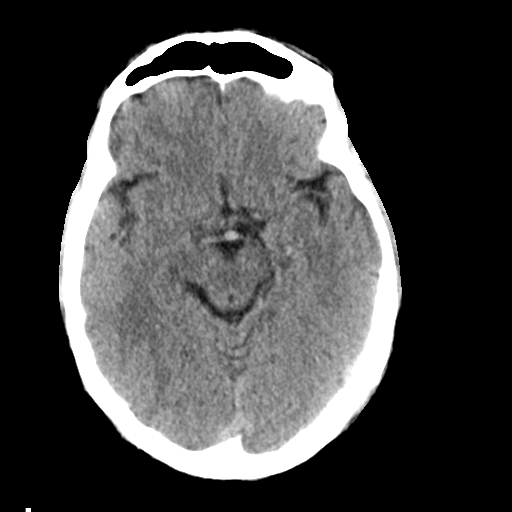
[im 17/33  brain]
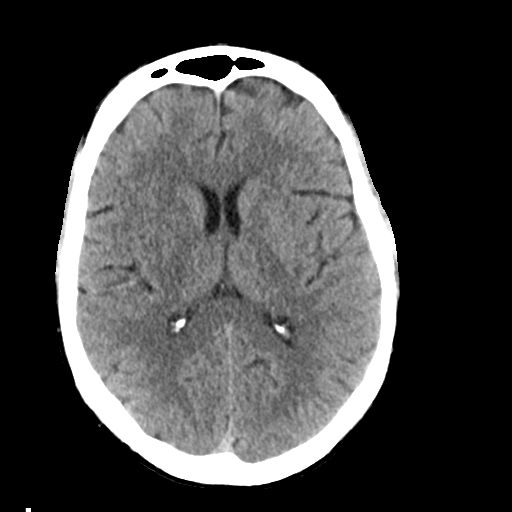
[im 17/33  bone]
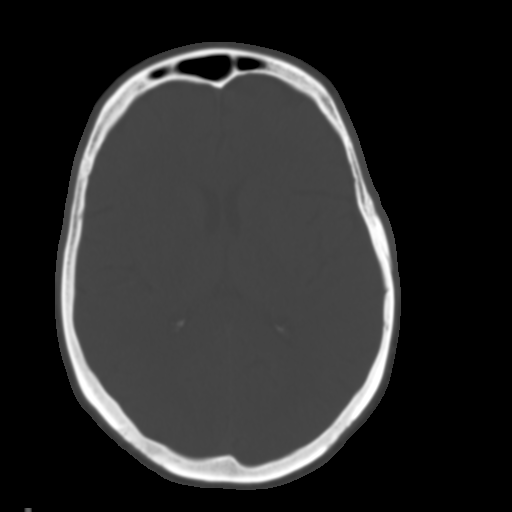
[im 20/33  brain]
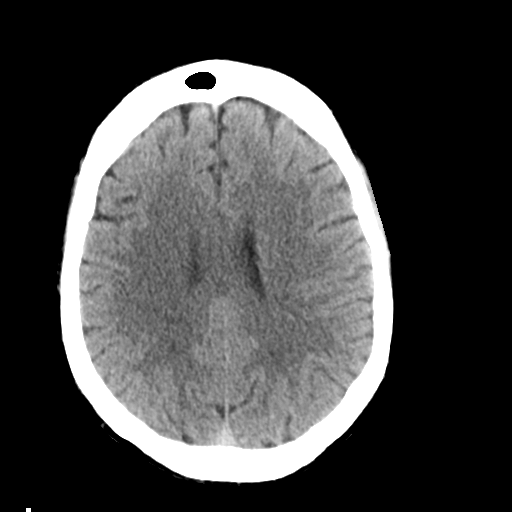
[im 24/33  brain]
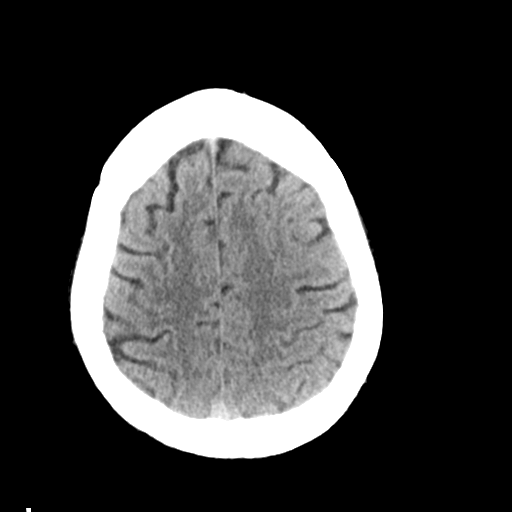
[im 27/33  brain]
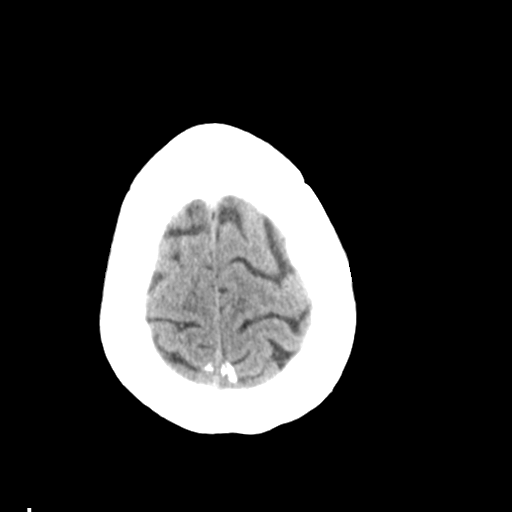
[im 30/33  brain]
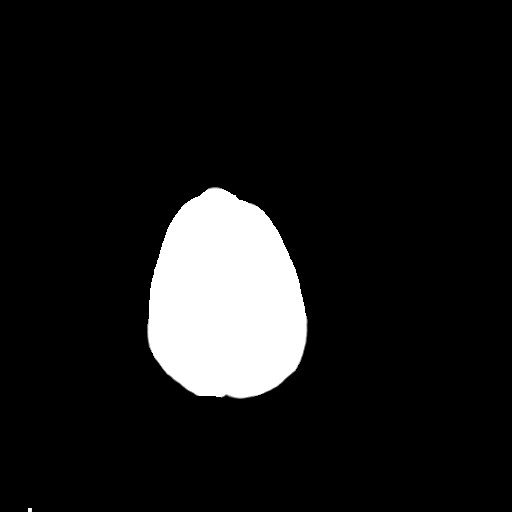
[im 30/33  bone]
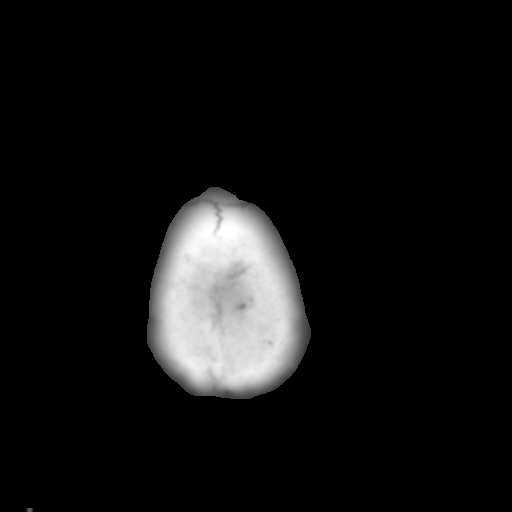

[Series 4: coronal soft tissue · coronal · 0.32mm/px · 3 of 73 slices shown]
[im 25/73  brain]
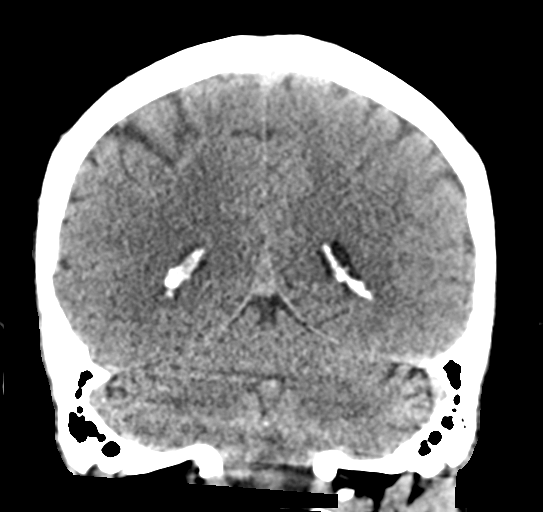
[im 33/73  brain]
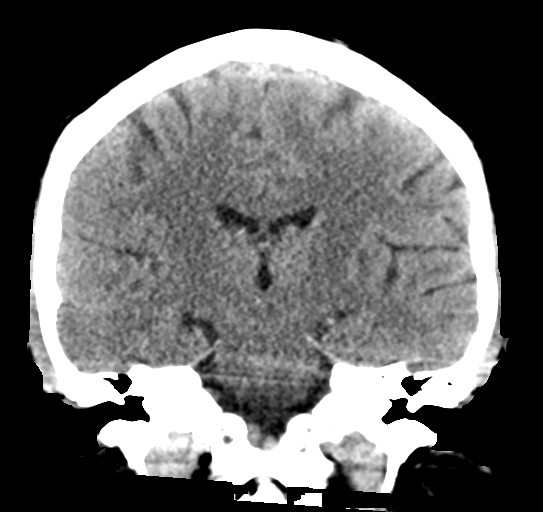
[im 41/73  brain]
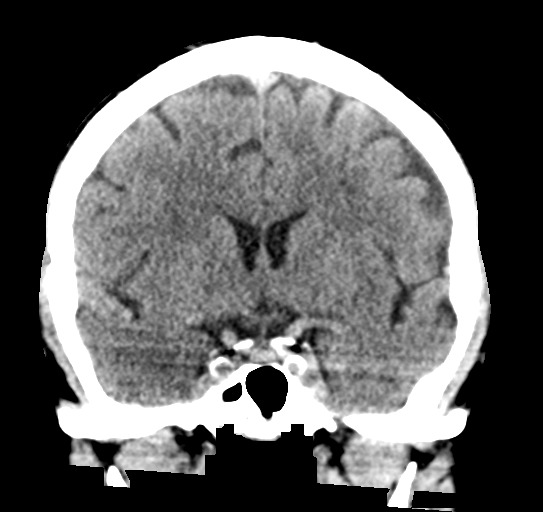

[Series 5: sagittal soft tissue · sagittal · 0.37mm/px · 3 of 67 slices shown]
[im 23/67  brain]
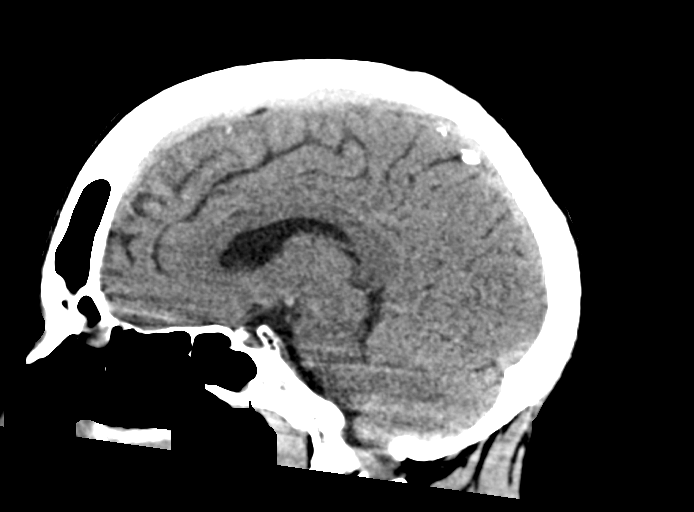
[im 34/67  brain]
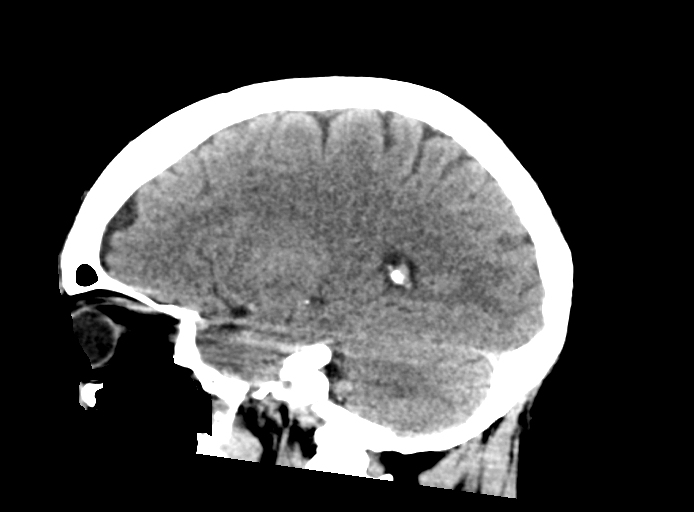
[im 45/67  brain]
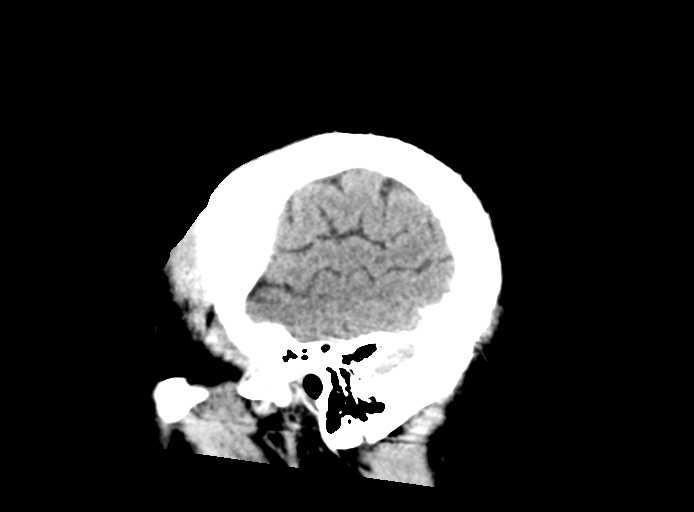

[15 of 47 positions shown; findings below may reference images not displayed]

FINDINGS: Brain: The brain appears normal without evidence of old or acute
infarction, mass lesion, hemorrhage, hydrocephalus or extra-axial
collection.

Vascular: No abnormal vascular finding.

Skull: Normal

Sinuses/Orbits: Clear/normal

Other: None

ASPECTS (Alberta Stroke Program Early CT Score)

- Ganglionic level infarction (caudate, lentiform nuclei, internal
capsule, insula, M1-M3 cortex): 7

- Supraganglionic infarction (M4-M6 cortex): 3

Total score (0-10 with 10 being normal): 10
IMPRESSION: 1. Normal study
2. ASPECTS is 10.
3. These results were called by telephone at the time of
interpretation on 02/11/2019 at [DATE] to Dr. NILTON STATEN ,
who verbally acknowledged these results.

## 2021-01-05 DIAGNOSIS — M19072 Primary osteoarthritis, left ankle and foot: Secondary | ICD-10-CM | POA: Diagnosis not present

## 2021-01-05 DIAGNOSIS — M47816 Spondylosis without myelopathy or radiculopathy, lumbar region: Secondary | ICD-10-CM | POA: Diagnosis not present

## 2021-01-19 DIAGNOSIS — E7800 Pure hypercholesterolemia, unspecified: Secondary | ICD-10-CM | POA: Diagnosis not present

## 2021-01-19 DIAGNOSIS — D126 Benign neoplasm of colon, unspecified: Secondary | ICD-10-CM | POA: Diagnosis not present

## 2021-01-19 DIAGNOSIS — Z125 Encounter for screening for malignant neoplasm of prostate: Secondary | ICD-10-CM | POA: Diagnosis not present

## 2021-01-19 DIAGNOSIS — N182 Chronic kidney disease, stage 2 (mild): Secondary | ICD-10-CM | POA: Diagnosis not present

## 2021-01-19 DIAGNOSIS — R7303 Prediabetes: Secondary | ICD-10-CM | POA: Diagnosis not present

## 2021-01-19 DIAGNOSIS — I10 Essential (primary) hypertension: Secondary | ICD-10-CM | POA: Diagnosis not present

## 2021-01-19 DIAGNOSIS — Z79899 Other long term (current) drug therapy: Secondary | ICD-10-CM | POA: Diagnosis not present

## 2021-02-05 DIAGNOSIS — M545 Low back pain, unspecified: Secondary | ICD-10-CM | POA: Diagnosis not present

## 2021-02-09 ENCOUNTER — Other Ambulatory Visit (HOSPITAL_COMMUNITY): Payer: Self-pay | Admitting: Physical Medicine & Rehabilitation

## 2021-02-09 DIAGNOSIS — M545 Low back pain, unspecified: Secondary | ICD-10-CM

## 2021-02-23 ENCOUNTER — Other Ambulatory Visit: Payer: Self-pay | Admitting: Physical Medicine & Rehabilitation

## 2021-02-23 DIAGNOSIS — M19072 Primary osteoarthritis, left ankle and foot: Secondary | ICD-10-CM | POA: Diagnosis not present

## 2021-02-23 DIAGNOSIS — M545 Low back pain, unspecified: Secondary | ICD-10-CM

## 2021-02-24 ENCOUNTER — Ambulatory Visit (HOSPITAL_COMMUNITY): Admission: RE | Admit: 2021-02-24 | Payer: Medicare Other | Source: Ambulatory Visit

## 2021-02-24 ENCOUNTER — Encounter (HOSPITAL_COMMUNITY): Payer: Self-pay

## 2021-02-26 DIAGNOSIS — M19072 Primary osteoarthritis, left ankle and foot: Secondary | ICD-10-CM | POA: Diagnosis not present

## 2021-03-04 ENCOUNTER — Other Ambulatory Visit: Payer: Self-pay | Admitting: Podiatry

## 2021-03-04 ENCOUNTER — Ambulatory Visit (INDEPENDENT_AMBULATORY_CARE_PROVIDER_SITE_OTHER): Payer: Medicare Other

## 2021-03-04 ENCOUNTER — Ambulatory Visit (INDEPENDENT_AMBULATORY_CARE_PROVIDER_SITE_OTHER): Payer: Medicare Other | Admitting: Podiatry

## 2021-03-04 ENCOUNTER — Other Ambulatory Visit: Payer: Self-pay

## 2021-03-04 DIAGNOSIS — M6740 Ganglion, unspecified site: Secondary | ICD-10-CM

## 2021-03-04 DIAGNOSIS — M67472 Ganglion, left ankle and foot: Secondary | ICD-10-CM | POA: Diagnosis not present

## 2021-03-04 DIAGNOSIS — M19072 Primary osteoarthritis, left ankle and foot: Secondary | ICD-10-CM

## 2021-03-04 DIAGNOSIS — M7752 Other enthesopathy of left foot: Secondary | ICD-10-CM | POA: Diagnosis not present

## 2021-03-04 DIAGNOSIS — M79672 Pain in left foot: Secondary | ICD-10-CM

## 2021-03-05 ENCOUNTER — Telehealth: Payer: Self-pay | Admitting: Podiatry

## 2021-03-05 LAB — UNLABELED: Test Ordered On Req: 4446

## 2021-03-05 NOTE — Telephone Encounter (Signed)
Lattie Haw or Butch Penny- can you please take care of this? It was the specimen from yesterday for a ganglion cyst we sent to pathology. Thanks!

## 2021-03-05 NOTE — Telephone Encounter (Signed)
[facility] called in stating the specimen for the following patient is missing name, without name it can't be processed. They would like to assist by sending over documentation to be filled out if you would still like specimen to be processed, Please Advise    Reference # [id number] [place] [facility] [phone]

## 2021-03-07 ENCOUNTER — Ambulatory Visit
Admission: RE | Admit: 2021-03-07 | Discharge: 2021-03-07 | Disposition: A | Discharge status: Home / Self Care | Payer: Medicare Other | Source: Ambulatory Visit | Attending: Physical Medicine & Rehabilitation | Admitting: Physical Medicine & Rehabilitation

## 2021-03-07 ENCOUNTER — Other Ambulatory Visit: Payer: Self-pay

## 2021-03-07 DIAGNOSIS — M545 Low back pain, unspecified: Secondary | ICD-10-CM

## 2021-03-07 DIAGNOSIS — M48061 Spinal stenosis, lumbar region without neurogenic claudication: Secondary | ICD-10-CM | POA: Diagnosis not present

## 2021-03-08 ENCOUNTER — Telehealth: Payer: Self-pay | Admitting: Podiatry

## 2021-03-08 DIAGNOSIS — M545 Low back pain, unspecified: Secondary | ICD-10-CM | POA: Diagnosis not present

## 2021-03-08 NOTE — Telephone Encounter (Signed)
The next morning he changed the bandage and he noticed. He has noticed some mild drainage, more liquid. He has not looked at it today but it was less last night. No pus. No redness, increase in swelling. No fevers, chills or any other concerns. He is going to change the bandage and if it is still draining then to let me know.   He has looked at inserts from [employer]. We discussed other insert options.

## 2021-03-08 NOTE — Telephone Encounter (Signed)
Patient called and stated that the left ankle is still draining, he would like Dr. Jacqualyn Posey to give him a call.  He also mention that he was interested in supine orthotic.

## 2021-03-09 NOTE — Telephone Encounter (Signed)
Spoke with [facility] and the representative Claiborne Billings B stated that it was done yesterday at 10:34 am and was pending. [name]

## 2021-03-09 NOTE — Progress Notes (Signed)
Subjective:   Patient ID: [name], male   DOB: 70 y.o.   MRN: [id number]   HPI 70 year old male presents the office over concerns of left ankle discomfort.  He previously seen orthopedics was told he needs to have an ankle replacement or fusion however he is going on vacation next couple months and he wants to try to be active and have some relief soon.  He has been tried meloxicam without significant improvement.  He points more to the lateral aspect of foot on the sinus tarsi gets majority discomfort is noted a localized area of swelling present.  No other treatment.  No recent injury.  No other concerns.   Review of Systems  All other systems reviewed and are negative.  Past Medical History:  Diagnosis Date  . Arthritis    "knees, fingers, elbows, toes" (02/19/2018)  . Dyslipidemia   . GERD (gastroesophageal reflux disease)   . Gout    "take RX daily" (02/19/2018)  . Non Hodgkin's lymphoma, stage IIA ([place]) 1998   non hodgkins lymphoma stage II  . Primary localized osteoarthrosis of the knee, right   . Tinnitus of left ear     Past Surgical History:  Procedure Laterality Date  . BIOPSY  12/21/2020   Procedure: BIOPSY;  Surgeon: Eloise Harman, DO;  [facility];  Service: Endoscopy;;  ileocecal valve biopsy  . COLONOSCOPY    . COLONOSCOPY WITH PROPOFOL N/A 12/21/2020   Procedure: COLONOSCOPY WITH PROPOFOL;  Surgeon: Eloise Harman, DO;  [facility];  Service: Endoscopy;  Laterality: N/A;  12:00  . JOINT REPLACEMENT    . KNEE ARTHROSCOPY Right 10/31/06  . MOUTH SURGERY     "related to replacing crown; reduced the pedistal"  . POLYPECTOMY  12/21/2020   Procedure: POLYPECTOMY INTESTINAL;  Surgeon: Eloise Harman, DO;  [facility];  Service: Endoscopy;;  cecal colon polyp; sigmoid colon polyp;   . TOE SURGERY Bilateral    Great toe each foot from athletic injuries; "ground the top of the bone off"  . TOTAL KNEE ARTHROPLASTY  Right 02/19/2018  . TOTAL KNEE ARTHROPLASTY Right 02/19/2018   Procedure: TOTAL KNEE ARTHROPLASTY;  Surgeon: Elsie Saas, MD;  Location: [place];  Service: Orthopedics;  Laterality: Right;     Current Outpatient Medications:  .  acetaminophen (TYLENOL) 325 MG tablet, Take 2 tablets (650 mg total) by mouth every 6 (six) hours as needed for mild pain., Disp: , Rfl:  .  allopurinol (ZYLOPRIM) 300 MG tablet, TAKE 1 TABLET(300 MG) BY MOUTH EVERY EVENING (Patient taking differently: Take 300 mg by mouth daily.), Disp: 30 tablet, Rfl: 1 .  aspirin EC 81 MG tablet, Take 81 mg by mouth at bedtime., Disp: , Rfl:  .  chlorthalidone (HYGROTON) 25 MG tablet, Take 1 tablet (25 mg total) by mouth daily. (Needs to be seen before next refill), Disp: 30 tablet, Rfl: 0 .  desonide (DESOWEN) 0.05 % cream, Apply 1 application topically daily as needed (irritation)., Disp: , Rfl:  .  diclofenac (VOLTAREN) 75 MG EC tablet, TAKE 1 TABLET BY MOUTH TWICE DAILY (Patient taking differently: Take 75 mg by mouth daily.), Disp: 240 tablet, Rfl: 3 .  lisinopril (ZESTRIL) 5 MG tablet, Take 5 mg by mouth daily., Disp: , Rfl:  .  pantoprazole (PROTONIX) 40 MG tablet, TAKE 1 TABLET BY MOUTH DAILY AFTER DINNER (Patient taking differently: Take 40 mg by mouth daily. TAKE 1 TABLET BY MOUTH  DAILY AFTER  DINNER), Disp: 90 tablet, Rfl: 3 .  simvastatin (ZOCOR) 20 MG tablet, TAKE 1 TABLET BY MOUTH DAILY AT 6 PM(PLEASE MAKE 6 MONTHS CHECK-UP WITH NEXT 30 DAYS) (Patient taking differently: Take 20 mg by mouth daily at 6 PM.), Disp: 30 tablet, Rfl: 0  Allergies  Allergen Reactions  . Indomethacin     Disoriented        Objective:  Physical Exam  General: AAO x3, NAD  Dermatological: Skin is warm, dry and supple bilateral.  There are no open sores, no preulcerative lesions, no rash or signs of infection present.  Vascular: Dorsalis Pedis artery and Posterior Tibial artery pedal pulses are 2/4 bilateral with immedate capillary fill  time. There is no pain with calf compression, swelling, warmth, erythema.   Neruologic: Grossly intact via light touch bilateral.   Musculoskeletal: Decreasing to motion the ankle and subtalar joint.  On the lateral aspect of the sinus tarsi is where he has majority discomfort and there is a welcome circumscribed mobile soft tissue mass present consistent with a ganglion cyst.  This is where he is majority of tenderness.  No other areas of discomfort.  MMT 5/5.  Gait: Unassisted, Nonantalgic.       Assessment:   Left ankle, subtalar joint arthritis, ganglion cyst    Plan:  -Treatment options discussed including all alternatives, risks, and complications -Etiology of symptoms were discussed -X-rays were obtained and reviewed with the patient.  There is no evidence of acute fracture or stress fracture.  Arthritic changes are noted. -Given the cyst discussed aspiration and steroid injection.  He wishes to proceed and verbal consent was obtained.  Skin was cleaned with alcohol and 3 cc of lidocaine, Marcaine plain was infiltrated in a regional block fashion.  Once anesthetized the skin was cleaned with Betadine.  An 18-gauge needle was utilized to aspirate clear, childlike substance consistent with ganglion cyst.  After likely able to decompress the cyst I utilized 1 cc Kenalog 10, 0.5 cc of Marcaine plain, 0.5 cc lidocaine plain and infiltrated in the ganglion cyst, subtalar joint without any complications.  Postinjection care discussed.  Tolerated procedure well.  Trula Slade DPM

## 2021-03-14 LAB — ANAEROBIC AND AEROBIC CULTURE
AER RESULT:: NO GROWTH
MICRO NUMBER:: 11865507
MICRO NUMBER:: 11865508
SPECIMEN QUALITY:: ADEQUATE
SPECIMEN QUALITY:: ADEQUATE

## 2021-03-14 LAB — PAT ID TIQ DOC: Test Affected: 4446

## 2021-03-15 DIAGNOSIS — M47816 Spondylosis without myelopathy or radiculopathy, lumbar region: Secondary | ICD-10-CM | POA: Diagnosis not present

## 2021-03-22 DIAGNOSIS — H353132 Nonexudative age-related macular degeneration, bilateral, intermediate dry stage: Secondary | ICD-10-CM | POA: Diagnosis not present

## 2021-03-22 DIAGNOSIS — H25813 Combined forms of age-related cataract, bilateral: Secondary | ICD-10-CM | POA: Diagnosis not present

## 2021-03-30 DIAGNOSIS — M48061 Spinal stenosis, lumbar region without neurogenic claudication: Secondary | ICD-10-CM | POA: Diagnosis not present

## 2021-03-30 DIAGNOSIS — M5459 Other low back pain: Secondary | ICD-10-CM | POA: Diagnosis not present

## 2021-04-01 DIAGNOSIS — M47816 Spondylosis without myelopathy or radiculopathy, lumbar region: Secondary | ICD-10-CM | POA: Diagnosis not present

## 2021-04-05 DIAGNOSIS — M47816 Spondylosis without myelopathy or radiculopathy, lumbar region: Secondary | ICD-10-CM | POA: Diagnosis not present

## 2021-04-09 DIAGNOSIS — M48061 Spinal stenosis, lumbar region without neurogenic claudication: Secondary | ICD-10-CM | POA: Diagnosis not present

## 2021-04-09 DIAGNOSIS — M5459 Other low back pain: Secondary | ICD-10-CM | POA: Diagnosis not present

## 2021-04-12 DIAGNOSIS — M25572 Pain in left ankle and joints of left foot: Secondary | ICD-10-CM | POA: Diagnosis not present

## 2021-04-14 DIAGNOSIS — M48061 Spinal stenosis, lumbar region without neurogenic claudication: Secondary | ICD-10-CM | POA: Diagnosis not present

## 2021-04-14 DIAGNOSIS — S233XXA Sprain of ligaments of thoracic spine, initial encounter: Secondary | ICD-10-CM | POA: Diagnosis not present

## 2021-04-14 DIAGNOSIS — M5459 Other low back pain: Secondary | ICD-10-CM | POA: Diagnosis not present

## 2021-04-14 DIAGNOSIS — M9901 Segmental and somatic dysfunction of cervical region: Secondary | ICD-10-CM | POA: Diagnosis not present

## 2021-04-14 DIAGNOSIS — M9902 Segmental and somatic dysfunction of thoracic region: Secondary | ICD-10-CM | POA: Diagnosis not present

## 2021-04-14 DIAGNOSIS — M9903 Segmental and somatic dysfunction of lumbar region: Secondary | ICD-10-CM | POA: Diagnosis not present

## 2021-04-14 DIAGNOSIS — M47812 Spondylosis without myelopathy or radiculopathy, cervical region: Secondary | ICD-10-CM | POA: Diagnosis not present

## 2021-04-14 DIAGNOSIS — M47816 Spondylosis without myelopathy or radiculopathy, lumbar region: Secondary | ICD-10-CM | POA: Diagnosis not present

## 2021-04-16 DIAGNOSIS — M48061 Spinal stenosis, lumbar region without neurogenic claudication: Secondary | ICD-10-CM | POA: Diagnosis not present

## 2021-04-16 DIAGNOSIS — M5459 Other low back pain: Secondary | ICD-10-CM | POA: Diagnosis not present

## 2021-04-19 ENCOUNTER — Ambulatory Visit (INDEPENDENT_AMBULATORY_CARE_PROVIDER_SITE_OTHER): Payer: Medicare Other | Admitting: Podiatry

## 2021-04-19 ENCOUNTER — Other Ambulatory Visit: Payer: Self-pay

## 2021-04-19 ENCOUNTER — Encounter: Payer: Self-pay | Admitting: Podiatry

## 2021-04-19 DIAGNOSIS — B977 Papillomavirus as the cause of diseases classified elsewhere: Secondary | ICD-10-CM | POA: Insufficient documentation

## 2021-04-19 DIAGNOSIS — Z79899 Other long term (current) drug therapy: Secondary | ICD-10-CM | POA: Insufficient documentation

## 2021-04-19 DIAGNOSIS — M19072 Primary osteoarthritis, left ankle and foot: Secondary | ICD-10-CM

## 2021-04-19 DIAGNOSIS — Z833 Family history of diabetes mellitus: Secondary | ICD-10-CM | POA: Insufficient documentation

## 2021-04-19 DIAGNOSIS — M67472 Ganglion, left ankle and foot: Secondary | ICD-10-CM

## 2021-04-19 DIAGNOSIS — D126 Benign neoplasm of colon, unspecified: Secondary | ICD-10-CM | POA: Insufficient documentation

## 2021-04-19 DIAGNOSIS — N182 Chronic kidney disease, stage 2 (mild): Secondary | ICD-10-CM | POA: Insufficient documentation

## 2021-04-19 DIAGNOSIS — G459 Transient cerebral ischemic attack, unspecified: Secondary | ICD-10-CM | POA: Insufficient documentation

## 2021-04-22 ENCOUNTER — Ambulatory Visit: Payer: Medicare Other | Admitting: Podiatry

## 2021-04-22 NOTE — Progress Notes (Signed)
Subjective: 70 year old male presents the office today for concerns of reoccurrence of a cyst on the lateral aspect of the left ankle, subtalar joint.  He states that after the aspiration injection lasted 1 minute and helped and his pain resolved.  He is going to vacation tomorrow wants to have some relief before he goes. Denies any systemic complaints such as fevers, chills, nausea, vomiting. No acute changes since last appointment, and no other complaints at this time.   Objective: AAO x3, NAD DP/PT pulses palpable bilaterally, CRT less than 3 seconds On the lateral aspect left elbow subtalar joint is a fluid-filled soft tissue mass which is consistent with reoccurrence of the ganglion cyst.  Tenderness palpation in this area.  No other areas of discomfort there is arthritic changes present to the ankle as well. No pain with calf compression, swelling, warmth, erythema  Assessment: Soft tissue mass left foot, ankle arthritis  Plan: -All treatment options discussed with the patient including all alternatives, risks, complications.  -Given ongoing symptoms recommend MRI which is ordered.  However he is going on vacation tomorrow.  I have released him back to have his cyst aspirated.  Skin was prepped with alcohol and 3 cc of lidocaine plain was infiltrated around the area in the region of fashion.  Skin was prepped with Betadine.  A 18-gauge needle was utilized to aspirate clear, childlike fluid.  There is no purulence or signs of infection.  After the cyst was drained I utilized 0.5 cc of Kenalog 10, 0.5 cc of lidocaine plain to infiltrate the soft tissue mass, subtalar joint.  He tolerated procedure well.  Compression bandage applied.  Postinjection care discussed. -Patient encouraged to call the office with any questions, concerns, change in symptoms.   Trula Slade DPM

## 2021-04-28 ENCOUNTER — Telehealth: Payer: Self-pay | Admitting: *Deleted

## 2021-04-28 NOTE — Telephone Encounter (Signed)
-----   Message from Trula Slade, DPM sent at 04/22/2021  7:51 AM EDT ----- I ordered an MRI on Monday. Can you please follow-up on this?

## 2021-04-28 NOTE — Telephone Encounter (Signed)
Talked to [facility] today and they have him on the list to call today. Lattie Haw

## 2021-05-05 ENCOUNTER — Telehealth: Payer: Self-pay | Admitting: *Deleted

## 2021-05-05 NOTE — Telephone Encounter (Signed)
Called and spoke with [name] at [facility] and representative stated that they called patient last Friday 04-30-2021 and patient stated that he had covid and would have to call back to re-schedule. Lattie Haw

## 2021-05-05 NOTE — Telephone Encounter (Signed)
-----   Message from Trula Slade, DPM sent at 04/22/2021  7:51 AM EDT ----- I ordered an MRI on Monday. Can you please follow-up on this?

## 2021-05-06 ENCOUNTER — Telehealth: Payer: Self-pay | Admitting: *Deleted

## 2021-05-06 ENCOUNTER — Other Ambulatory Visit: Payer: Self-pay | Admitting: Podiatry

## 2021-05-06 DIAGNOSIS — M19072 Primary osteoarthritis, left ankle and foot: Secondary | ICD-10-CM

## 2021-05-06 NOTE — Telephone Encounter (Signed)
Patient is calling to let the doctor know that he is quarantined due to Covid, will probably miss his scheduled MRI.He is also wanting to request physical therapy for his ankle. Please advise.

## 2021-05-07 ENCOUNTER — Telehealth: Payer: Self-pay | Admitting: *Deleted

## 2021-05-07 NOTE — Telephone Encounter (Signed)
Patient is returning call and would rather not go to [facility] for physical therapy, in past they are always trying to sale devices.he would like to go to a facility in [place] or [facility]([phone],[id number] and left message to call back with name).  He said that his ankle is better but the cyst that was drained is back causing pain. The inserts did help, will probably need another one. Please advise.

## 2021-05-07 NOTE — Telephone Encounter (Signed)
Returned call to patient, no answer, left vmessage that a referral has been sent to [facility]

## 2021-05-12 NOTE — Telephone Encounter (Signed)
I called and left a message for the Physical therapist [facility]) to call me back and get the patient set up for physical therapy. [name]

## 2021-05-13 ENCOUNTER — Ambulatory Visit
Admission: RE | Admit: 2021-05-13 | Discharge: 2021-05-13 | Disposition: A | Discharge status: Home / Self Care | Payer: Medicare Other | Source: Ambulatory Visit | Attending: Podiatry | Admitting: Podiatry

## 2021-05-13 ENCOUNTER — Other Ambulatory Visit: Payer: Self-pay

## 2021-05-13 DIAGNOSIS — M67472 Ganglion, left ankle and foot: Secondary | ICD-10-CM | POA: Diagnosis not present

## 2021-05-13 DIAGNOSIS — M19072 Primary osteoarthritis, left ankle and foot: Secondary | ICD-10-CM | POA: Diagnosis not present

## 2021-05-13 DIAGNOSIS — M25472 Effusion, left ankle: Secondary | ICD-10-CM | POA: Diagnosis not present

## 2021-05-13 DIAGNOSIS — M65872 Other synovitis and tenosynovitis, left ankle and foot: Secondary | ICD-10-CM | POA: Diagnosis not present

## 2021-05-21 ENCOUNTER — Telehealth: Payer: Self-pay | Admitting: Podiatry

## 2021-05-21 NOTE — Telephone Encounter (Signed)
Patient would like to come in asap to have cyst on left foot drained. Is there a way we can work him in sooner than his upcoming appointment?

## 2021-05-21 NOTE — Telephone Encounter (Signed)
Patient is coming in on Monday 05-24-2021. [name]

## 2021-05-24 ENCOUNTER — Encounter: Payer: Self-pay | Admitting: Podiatry

## 2021-05-24 ENCOUNTER — Ambulatory Visit (INDEPENDENT_AMBULATORY_CARE_PROVIDER_SITE_OTHER): Payer: Medicare Other | Admitting: Podiatry

## 2021-05-24 ENCOUNTER — Other Ambulatory Visit: Payer: Self-pay

## 2021-05-24 DIAGNOSIS — M67472 Ganglion, left ankle and foot: Secondary | ICD-10-CM

## 2021-05-24 MED ORDER — TRIAMCINOLONE ACETONIDE 10 MG/ML IJ SUSP
10.0000 mg | Freq: Once | INTRAMUSCULAR | Status: AC
  Administered 2021-05-24: 10 mg

## 2021-05-24 NOTE — Patient Instructions (Signed)
Dr. Francee Gentile, MD

## 2021-05-27 ENCOUNTER — Telehealth: Payer: Self-pay | Admitting: Podiatry

## 2021-05-27 NOTE — Telephone Encounter (Signed)
Patient called the office wanting to know the name of the recommended Ankle Surgeon at [place]

## 2021-05-27 NOTE — Progress Notes (Signed)
Subjective: 70 year old male presents the office today for concerns of reoccurrence of a cyst on the lateral aspect of the left ankle, subtalar joint.  He looked at this area drained today as it does help alleviate discomfort.  Had an MRI.  Before surgery he wanted to try physical therapy.  This order was previously placed.  No recent injury or trauma changes otherwise. Denies any systemic complaints such as fevers, chills, nausea, vomiting. No acute changes since last appointment, and no other complaints at this time.   Objective: AAO x3, NAD DP/PT pulses palpable bilaterally, CRT less than 3 seconds On the lateral aspect left subtalar joint is a fluid-filled soft tissue mass which is consistent with reoccurrence of the ganglion cyst.  Tenderness palpation in this area.  Arthritic changes noted to the ankle. No pain with calf compression, swelling, warmth, erythema  Assessment: Soft tissue mass left foot, ankle arthritis  Plan: -All treatment options discussed with the patient including all alternatives, risks, complications.  -kin was prepped with alcohol and 3 cc of lidocaine plain was infiltrated around the area in the region of fashion.  Skin was prepped with Betadine.  A 18-gauge needle was utilized to aspirate clear, childlike fluid.  There is no purulence or signs of infection.  After the cyst was drained I utilized 0.5 cc of Kenalog 10, 0.5 cc of lidocaine plain to infiltrate the soft tissue mass, subtalar joint.  He tolerated procedure well.  Compression bandage applied.  Postinjection care discussed. -Discussed physical therapy.  I do not think this is likely to be beneficial but he wants to try.  I would recommend follow-up with orthopedics for surgical intervention. -Patient encouraged to call the office with any questions, concerns, change in symptoms.   Trula Slade DPM

## 2021-05-28 NOTE — Telephone Encounter (Signed)
I called [name] back this morning to let him know the name of the surgeon.

## 2021-05-31 ENCOUNTER — Ambulatory Visit: Payer: Medicare Other | Admitting: Podiatry

## 2021-05-31 DIAGNOSIS — R351 Nocturia: Secondary | ICD-10-CM | POA: Diagnosis not present

## 2021-05-31 DIAGNOSIS — N401 Enlarged prostate with lower urinary tract symptoms: Secondary | ICD-10-CM | POA: Diagnosis not present

## 2021-05-31 DIAGNOSIS — N486 Induration penis plastica: Secondary | ICD-10-CM | POA: Diagnosis not present

## 2021-05-31 DIAGNOSIS — N503 Cyst of epididymis: Secondary | ICD-10-CM | POA: Diagnosis not present

## 2021-05-31 DIAGNOSIS — N138 Other obstructive and reflux uropathy: Secondary | ICD-10-CM | POA: Diagnosis not present

## 2021-05-31 DIAGNOSIS — F5221 Male erectile disorder: Secondary | ICD-10-CM | POA: Diagnosis not present

## 2021-06-21 ENCOUNTER — Telehealth: Payer: Self-pay | Admitting: *Deleted

## 2021-06-21 NOTE — Telephone Encounter (Signed)
Patient is calling and wanted to know if we are able to make MRI, xrays available to Dr Francee Gentile at [facility] for upcoming appointment. Called Dr Bary Leriche office,will fax over office xrays, MRI report. Patient may pick up the disc and take with him for visit.they do not have access to epic system.

## 2021-06-22 ENCOUNTER — Telehealth: Payer: Self-pay | Admitting: Podiatry

## 2021-06-22 DIAGNOSIS — M19072 Primary osteoarthritis, left ankle and foot: Secondary | ICD-10-CM | POA: Insufficient documentation

## 2021-06-22 DIAGNOSIS — M67472 Ganglion, left ankle and foot: Secondary | ICD-10-CM | POA: Diagnosis not present

## 2021-06-22 DIAGNOSIS — S86312A Strain of muscle(s) and tendon(s) of peroneal muscle group at lower leg level, left leg, initial encounter: Secondary | ICD-10-CM | POA: Diagnosis not present

## 2021-06-22 DIAGNOSIS — M25572 Pain in left ankle and joints of left foot: Secondary | ICD-10-CM | POA: Diagnosis not present

## 2021-06-22 DIAGNOSIS — S96912A Strain of unspecified muscle and tendon at ankle and foot level, left foot, initial encounter: Secondary | ICD-10-CM | POA: Diagnosis not present

## 2021-06-22 NOTE — Telephone Encounter (Signed)
Pt called requesting to speak with you or Lattie Haw directly. He has completed his appt with Dr. Nicki Reaper and he also needs his foot drained. Please advise.

## 2021-06-29 ENCOUNTER — Ambulatory Visit (INDEPENDENT_AMBULATORY_CARE_PROVIDER_SITE_OTHER): Payer: Medicare Other | Admitting: Podiatry

## 2021-06-29 ENCOUNTER — Other Ambulatory Visit: Payer: Self-pay

## 2021-06-29 DIAGNOSIS — M19072 Primary osteoarthritis, left ankle and foot: Secondary | ICD-10-CM

## 2021-06-29 DIAGNOSIS — M67472 Ganglion, left ankle and foot: Secondary | ICD-10-CM | POA: Diagnosis not present

## 2021-06-29 NOTE — Patient Instructions (Signed)

## 2021-07-06 ENCOUNTER — Encounter: Payer: Self-pay | Admitting: Podiatry

## 2021-07-06 NOTE — Progress Notes (Signed)
Subjective: 70 year old male presents the office today for concerns of reoccurrence of a cyst on the lateral aspect of the left ankle, subtalar joint.  He did follow-up with orthopedics, Dr. Francee Gentile, MD. apparently felt like internal symptoms are coming from the cyst.  He was also apparently not a candidate for ankle replacement per Dr. Bary Leriche recommendations but there would be other doctors that would likely perform it.  The patient presents today requesting surgical intervention to remove the cyst as it seems to be causing the majority of his discomfort.  We have attempted drainage previously and it helped for about a week before it starts to come back.  Objective: AAO x3, NAD DP/PT pulses palpable bilaterally, CRT less than 3 seconds On the lateral aspect left subtalar joint is a fluid-filled soft tissue mass which is consistent with reoccurrence of the ganglion cyst.  Tenderness palpation in this area.  Arthritic changes noted to the ankle. No pain with calf compression, swelling, warmth, erythema  Assessment: Soft tissue mass left foot, ankle arthritis  Plan: -All treatment options discussed with the patient including all alternatives, risks, complications.  -Discussed with conservative as well as surgical options.  Discussed with excision surgically of the cyst however is not a guarantee.  Given the arthritis and gait changes to use a high risk this is can come back but he wants to attempt. -The incision placement as well as the postoperative course was discussed with the patient. I discussed risks of the surgery which include, but not limited to, infection, bleeding, pain, swelling, need for further surgery, delayed or nonhealing, painful or ugly scar, numbness or sensation changes, over/under correction, recurrence, further deformity, DVT/PE, loss of toe/foot. Patient understands these risks and wishes to proceed with surgery. The surgical consent was reviewed with the patient all 3 pages  were signed. No promises or guarantees were given to the outcome of the procedure. All questions were answered to the best of my ability. Before the surgery the patient was encouraged to call the office if there is any further questions. The surgery will be performed at the [facility] on an outpatient basis.  Trula Slade DPM

## 2021-07-12 DIAGNOSIS — M19072 Primary osteoarthritis, left ankle and foot: Secondary | ICD-10-CM | POA: Diagnosis not present

## 2021-07-12 DIAGNOSIS — Z79899 Other long term (current) drug therapy: Secondary | ICD-10-CM | POA: Diagnosis not present

## 2021-07-12 DIAGNOSIS — Z136 Encounter for screening for cardiovascular disorders: Secondary | ICD-10-CM | POA: Diagnosis not present

## 2021-07-12 DIAGNOSIS — N529 Male erectile dysfunction, unspecified: Secondary | ICD-10-CM | POA: Diagnosis not present

## 2021-07-14 ENCOUNTER — Encounter: Payer: Self-pay | Admitting: Podiatry

## 2021-07-14 ENCOUNTER — Other Ambulatory Visit: Payer: Self-pay | Admitting: Podiatry

## 2021-07-14 DIAGNOSIS — M67472 Ganglion, left ankle and foot: Secondary | ICD-10-CM | POA: Diagnosis not present

## 2021-07-14 DIAGNOSIS — M7989 Other specified soft tissue disorders: Secondary | ICD-10-CM | POA: Diagnosis not present

## 2021-07-14 MED ORDER — ONDANSETRON HCL 4 MG PO TABS: 4.0000 mg | ORAL_TABLET | Freq: Three times a day (TID) | ORAL | 0 refills | Status: AC | PRN

## 2021-07-14 MED ORDER — HYDROCODONE-ACETAMINOPHEN 5-325 MG PO TABS: 1.0000 | ORAL_TABLET | Freq: Four times a day (QID) | ORAL | 0 refills | Status: DC | PRN

## 2021-07-14 MED ORDER — CEPHALEXIN 500 MG PO CAPS: 500.0000 mg | ORAL_CAPSULE | Freq: Three times a day (TID) | ORAL | 0 refills | Status: DC

## 2021-07-14 NOTE — Progress Notes (Signed)
Postop medications sent 

## 2021-07-15 ENCOUNTER — Other Ambulatory Visit: Payer: Self-pay | Admitting: Podiatry

## 2021-07-15 ENCOUNTER — Telehealth: Payer: Self-pay | Admitting: Podiatry

## 2021-07-15 MED ORDER — OXYCODONE-ACETAMINOPHEN 5-325 MG PO TABS: 1.0000 | ORAL_TABLET | Freq: Four times a day (QID) | ORAL | 0 refills | Status: DC | PRN

## 2021-07-15 NOTE — Telephone Encounter (Signed)
He didn't understand what this was exactly for and I explained it to him. He is okay with [facility] coming out. Also, he did get the percocet and he is feeling much better this afternoon.

## 2021-07-15 NOTE — Telephone Encounter (Signed)
Patient wife called and stated that he is in severe pain, the pain meds are not working. Patient threw up this morning, he is not sure if its because of the pain or the medication. Please call patient ASAP

## 2021-07-15 NOTE — Telephone Encounter (Signed)
Called patient - let him know to restart on regular medications per Dr. Jacqualyn Posey.

## 2021-07-15 NOTE — Telephone Encounter (Signed)
Patient called and stated that the referral was sent to [facility] in [place] but he lives in [place]. He would like PT to be schedule near his home..  [facility] Dr. Verl Dicker Phone # [phone] Fax # [phone]

## 2021-07-15 NOTE — Telephone Encounter (Signed)
Patient wife call back, she forgot to ask was it ok for [name] to start back taking his regular medication, especially the blood pressure meds. He had stop taking them before the sx.

## 2021-07-15 NOTE — Telephone Encounter (Signed)
Called pt - informed him percocet was sent into the pharmacy, advised taking off the boot and unwrapping only the ace bandage, leaving it off for about 15 minutes, then re-wrapping the ace bandage slightly looser than it was. Continue ice and elevation and to stay off foot as much as possible.

## 2021-07-16 ENCOUNTER — Telehealth: Payer: Self-pay | Admitting: Podiatry

## 2021-07-16 ENCOUNTER — Telehealth: Payer: Self-pay | Admitting: *Deleted

## 2021-07-16 NOTE — Telephone Encounter (Signed)
Wife called and stated that [name] is unable to keep any food down. He has been throwing up since last night at 10pm. He also threw up again at 5am and 8:00am this morning. He is unable to keep medicine and food down. He does not have a temp at the moment.  Wife would like someone to call her back to let her know what she can do.

## 2021-07-16 NOTE — Telephone Encounter (Signed)
Patient was already called by Dr. Jacqualyn Posey- Thanks

## 2021-07-16 NOTE — Telephone Encounter (Signed)
Called and spoke with patient's wife ([name]) and she stated that the patient was doing better since talking to Dr Jacqualyn Posey around 10:30 am this morning and is waiting two hours to take the pain medicine and has not thrown up since 10:30 am and is resting and icing and has had some crackers and mashed potatoes and I stated to call the office with any concerns or questions. [name]

## 2021-07-20 ENCOUNTER — Other Ambulatory Visit: Payer: Self-pay

## 2021-07-20 ENCOUNTER — Ambulatory Visit (INDEPENDENT_AMBULATORY_CARE_PROVIDER_SITE_OTHER): Payer: Medicare Other | Admitting: Podiatry

## 2021-07-20 ENCOUNTER — Encounter: Payer: Self-pay | Admitting: Podiatry

## 2021-07-20 VITALS — Temp 97.9°F

## 2021-07-20 DIAGNOSIS — M67472 Ganglion, left ankle and foot: Secondary | ICD-10-CM

## 2021-07-20 DIAGNOSIS — Z9889 Other specified postprocedural states: Secondary | ICD-10-CM

## 2021-07-20 MED ORDER — CEPHALEXIN 500 MG PO CAPS: 500.0000 mg | ORAL_CAPSULE | Freq: Three times a day (TID) | ORAL | 0 refills | Status: DC

## 2021-07-20 MED ORDER — OXYCODONE-ACETAMINOPHEN 5-325 MG PO TABS: 1.0000 | ORAL_TABLET | Freq: Four times a day (QID) | ORAL | 0 refills | Status: DC | PRN

## 2021-07-22 ENCOUNTER — Encounter: Payer: Self-pay | Admitting: Podiatry

## 2021-07-27 NOTE — Progress Notes (Signed)
Subjective: [name] is a 70 y.o. is seen today in office s/p left ankle soft tissue mass excision.  Overall he states he is feeling better.  The nausea he is having is also much improved.  He is in wearing the cam boot and staying off the foot is much as possible.  No fevers or chills.  He has no other concerns.   Objective: General: No acute distress, AAOx3  DP/PT pulses palpable 2/4, CRT < 3 sec to all digits.  Protective sensation intact. Motor function intact.  Left foot: Incision is well coapted without any evidence of dehiscence with sutures, staples intact. There is no surrounding erythema, ascending cellulitis, fluctuance, crepitus, malodor, drainage/purulence. There is mild edema around the surgical site. There is mild pain along the surgical site.  No other areas of tenderness to bilateral lower extremities.  No other open lesions or pre-ulcerative lesions.  No pain with calf compression, swelling, warmth, erythema.   Assessment and Plan:  Status post left foot soft tissue mass excision, doing well with no complications   -Treatment options discussed including all alternatives, risks, and complications -Incisions healing well with any signs of dehiscence.  Overall he is feeling better.  A small amount of antibiotic ointment was applied followed by dressing.  He will keep dressing clean, dry, intact.  May nonweightbearing cam boot. -Ice/elevation -Pain medication as needed. -Monitor for any clinical signs or symptoms of infection and DVT/PE and directed to call the office immediately should any occur or go to the ER. -Follow-up as scheduled or sooner if any problems arise. In the meantime, encouraged to call the office with any questions, concerns, change in symptoms.   Celesta Gentile, DPM

## 2021-08-02 ENCOUNTER — Telehealth: Payer: Self-pay | Admitting: *Deleted

## 2021-08-02 ENCOUNTER — Encounter: Payer: Medicare Other | Admitting: Podiatry
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
## 2021-08-03 ENCOUNTER — Telehealth: Payer: Self-pay | Admitting: *Deleted
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
## 2021-08-03 NOTE — Telephone Encounter (Signed)
Returned the call to patient and gave Dr Leigh Aurora recommendations for changing bandage,verbalized understanding.

## 2021-08-05 ENCOUNTER — Encounter: Payer: Self-pay | Admitting: Podiatry

## 2021-08-16 ENCOUNTER — Ambulatory Visit (INDEPENDENT_AMBULATORY_CARE_PROVIDER_SITE_OTHER): Payer: Medicare Other | Admitting: Podiatry

## 2021-08-16 ENCOUNTER — Other Ambulatory Visit: Payer: Self-pay

## 2021-08-16 DIAGNOSIS — M67472 Ganglion, left ankle and foot: Secondary | ICD-10-CM

## 2021-08-16 DIAGNOSIS — Z9889 Other specified postprocedural states: Secondary | ICD-10-CM

## 2021-08-16 MED ORDER — CEPHALEXIN 500 MG PO CAPS: 500.0000 mg | ORAL_CAPSULE | Freq: Three times a day (TID) | ORAL | 0 refills | Status: DC

## 2021-08-23 DIAGNOSIS — R040 Epistaxis: Secondary | ICD-10-CM | POA: Diagnosis not present
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
## 2021-08-30 DIAGNOSIS — L813 Cafe au lait spots: Secondary | ICD-10-CM | POA: Diagnosis not present

## 2021-08-30 DIAGNOSIS — L570 Actinic keratosis: Secondary | ICD-10-CM | POA: Diagnosis not present

## 2021-08-30 DIAGNOSIS — D1801 Hemangioma of skin and subcutaneous tissue: Secondary | ICD-10-CM | POA: Diagnosis not present

## 2021-08-30 DIAGNOSIS — Z1283 Encounter for screening for malignant neoplasm of skin: Secondary | ICD-10-CM | POA: Diagnosis not present

## 2021-09-09 ENCOUNTER — Ambulatory Visit (INDEPENDENT_AMBULATORY_CARE_PROVIDER_SITE_OTHER): Payer: Medicare Other | Admitting: Podiatry

## 2021-09-09 ENCOUNTER — Other Ambulatory Visit: Payer: Self-pay

## 2021-09-09 DIAGNOSIS — M25472 Effusion, left ankle: Secondary | ICD-10-CM

## 2021-09-09 DIAGNOSIS — M7662 Achilles tendinitis, left leg: Secondary | ICD-10-CM

## 2021-09-09 DIAGNOSIS — M19072 Primary osteoarthritis, left ankle and foot: Secondary | ICD-10-CM

## 2021-09-11 DIAGNOSIS — M25572 Pain in left ankle and joints of left foot: Secondary | ICD-10-CM | POA: Diagnosis not present

## 2021-09-11 DIAGNOSIS — M19072 Primary osteoarthritis, left ankle and foot: Secondary | ICD-10-CM | POA: Diagnosis not present

## 2021-09-11 DIAGNOSIS — M7662 Achilles tendinitis, left leg: Secondary | ICD-10-CM | POA: Diagnosis not present

## 2021-09-11 DIAGNOSIS — M79672 Pain in left foot: Secondary | ICD-10-CM | POA: Diagnosis not present

## 2021-09-11 DIAGNOSIS — M6281 Muscle weakness (generalized): Secondary | ICD-10-CM | POA: Diagnosis not present

## 2021-09-12 NOTE — Progress Notes (Signed)
Subjective: [name] is a 70 y.o. is seen today in office s/p left ankle soft tissue mass excision.  States that he has been doing better.  He still has a small scab along the incision.  He had some minor pain and swelling.  He has been doing some pain on the Achilles tendon.  He is interested in physical therapy.  Discussed about possible steroid injection but otherwise he has been doing well.    He states he wants to get the holidays and then reconsider his other surgical options for his ankle with orthopedics.  Objective: General: No acute distress, AAOx3  DP/PT pulses palpable 2/4, CRT < 3 sec to all digits.  Protective sensation intact. Motor function intact.  Left foot: Incision is well coapted without any evidence of dehiscence.  1 small scab is present on the incision which is over the sinus tarsi.  There is no edema, erythema, drainage or pus.  Minimal edema.  No signs of infection otherwise.  No discomfort on the Achilles tendon but [name] test is negative the Achilles tendon appears to be intact.  Unable to palpate the borders. No other open lesions or pre-ulcerative lesions.  No pain with calf compression, swelling, warmth, erythema.   Assessment and Plan:  Status post left foot soft tissue mass excision, doing well with no complications; he will cellulitis  -Treatment options discussed including all alternatives, risks, and complications -Recommended to wash the incision with soap and water daily and the scab should fall off on its own.  Can apply a small amount of antibiotic ointment and a bandage.  We discussed steroid injection however decided to hold off on this until the scab comes off.  I will see him back in about 3 weeks and if needed we can do the injection. -Referral to physical therapy  Trula Slade DPM

## 2021-09-15 DIAGNOSIS — M79672 Pain in left foot: Secondary | ICD-10-CM | POA: Diagnosis not present

## 2021-09-15 DIAGNOSIS — M7662 Achilles tendinitis, left leg: Secondary | ICD-10-CM | POA: Diagnosis not present

## 2021-09-15 DIAGNOSIS — M6281 Muscle weakness (generalized): Secondary | ICD-10-CM | POA: Diagnosis not present

## 2021-09-15 DIAGNOSIS — M19072 Primary osteoarthritis, left ankle and foot: Secondary | ICD-10-CM | POA: Diagnosis not present

## 2021-09-15 DIAGNOSIS — M25572 Pain in left ankle and joints of left foot: Secondary | ICD-10-CM | POA: Diagnosis not present

## 2021-09-21 DIAGNOSIS — M6281 Muscle weakness (generalized): Secondary | ICD-10-CM | POA: Diagnosis not present

## 2021-09-21 DIAGNOSIS — M25572 Pain in left ankle and joints of left foot: Secondary | ICD-10-CM | POA: Diagnosis not present

## 2021-09-21 DIAGNOSIS — M79672 Pain in left foot: Secondary | ICD-10-CM | POA: Diagnosis not present

## 2021-09-21 DIAGNOSIS — M7662 Achilles tendinitis, left leg: Secondary | ICD-10-CM | POA: Diagnosis not present

## 2021-09-21 DIAGNOSIS — M19072 Primary osteoarthritis, left ankle and foot: Secondary | ICD-10-CM | POA: Diagnosis not present

## 2021-09-27 DIAGNOSIS — Z23 Encounter for immunization: Secondary | ICD-10-CM | POA: Diagnosis not present

## 2021-09-30 DIAGNOSIS — M79672 Pain in left foot: Secondary | ICD-10-CM | POA: Diagnosis not present

## 2021-09-30 DIAGNOSIS — M6281 Muscle weakness (generalized): Secondary | ICD-10-CM | POA: Diagnosis not present

## 2021-09-30 DIAGNOSIS — M25572 Pain in left ankle and joints of left foot: Secondary | ICD-10-CM | POA: Diagnosis not present

## 2021-09-30 DIAGNOSIS — M19072 Primary osteoarthritis, left ankle and foot: Secondary | ICD-10-CM | POA: Diagnosis not present

## 2021-09-30 DIAGNOSIS — M7662 Achilles tendinitis, left leg: Secondary | ICD-10-CM | POA: Diagnosis not present

## 2021-10-01 ENCOUNTER — Other Ambulatory Visit: Payer: Self-pay

## 2021-10-01 ENCOUNTER — Ambulatory Visit (INDEPENDENT_AMBULATORY_CARE_PROVIDER_SITE_OTHER): Payer: Medicare Other | Admitting: Podiatry

## 2021-10-01 DIAGNOSIS — M7752 Other enthesopathy of left foot: Secondary | ICD-10-CM | POA: Diagnosis not present

## 2021-10-01 MED ORDER — TRIAMCINOLONE ACETONIDE 10 MG/ML IJ SUSP
10.0000 mg | Freq: Once | INTRAMUSCULAR | Status: AC
  Administered 2021-10-01: 10 mg

## 2021-10-04 NOTE — Progress Notes (Signed)
Subjective: [name] is a 70 y.o. is seen today in office s/p left ankle soft tissue mass excision.  States he is doing well.  Has been doing physical therapy for the tendinitis.  Is asking for steroid injection today which we discussed last appointment.  He denies any recent injuries.  No other concerns.  He states he wants to get the holidays and then reconsider his other surgical options for his ankle with orthopedics.  Objective: General: No acute distress, AAOx3  DP/PT pulses palpable 2/4, CRT < 3 sec to all digits.  Protective sensation intact. Motor function intact.  Left foot: Incision is well coapted without any evidence of dehiscence.  Scar is well formed.  There is tenderness palpation on sinus tarsi.  Mild discomfort along the course of the posterior, flexor tendons as well as the Achilles tendon but clinically there appear to be intact.  No significant increase in edema is no erythema or warmth. No other open lesions or pre-ulcerative lesions.  No pain with calf compression, swelling, warmth, erythema.   Assessment and Plan:  Status post left foot soft tissue mass excision, doing well with no complications; capsulitis  -Treatment options discussed including all alternatives, risks, and complications -Incisions well-healed about any signs of infection.  He wants to proceed with a steroid injection today.  Discussed risks of this.  He understands and wishes to proceed.  Skin was prepped with Betadine and a mixture 1 cc Kenalog 10, 0.5 cc of Marcaine plain, 0.5 cc of lidocaine plain was infiltrated into the sinus tarsi without complications.  Postinjection care was discussed.  Discussed to limit activity for the next several days to help with the injection. -Physical therapy  Trula Slade DPM

## 2021-10-07 DIAGNOSIS — M19072 Primary osteoarthritis, left ankle and foot: Secondary | ICD-10-CM | POA: Diagnosis not present

## 2021-10-07 DIAGNOSIS — M7662 Achilles tendinitis, left leg: Secondary | ICD-10-CM | POA: Diagnosis not present

## 2021-10-07 DIAGNOSIS — M6281 Muscle weakness (generalized): Secondary | ICD-10-CM | POA: Diagnosis not present

## 2021-10-07 DIAGNOSIS — M79672 Pain in left foot: Secondary | ICD-10-CM | POA: Diagnosis not present

## 2021-10-07 DIAGNOSIS — M25572 Pain in left ankle and joints of left foot: Secondary | ICD-10-CM | POA: Diagnosis not present

## 2021-10-13 DIAGNOSIS — M6281 Muscle weakness (generalized): Secondary | ICD-10-CM | POA: Diagnosis not present

## 2021-10-13 DIAGNOSIS — M79672 Pain in left foot: Secondary | ICD-10-CM | POA: Diagnosis not present

## 2021-10-13 DIAGNOSIS — M19072 Primary osteoarthritis, left ankle and foot: Secondary | ICD-10-CM | POA: Diagnosis not present

## 2021-10-13 DIAGNOSIS — M7662 Achilles tendinitis, left leg: Secondary | ICD-10-CM | POA: Diagnosis not present

## 2021-10-13 DIAGNOSIS — M25572 Pain in left ankle and joints of left foot: Secondary | ICD-10-CM | POA: Diagnosis not present

## 2021-10-15 DIAGNOSIS — M79672 Pain in left foot: Secondary | ICD-10-CM | POA: Diagnosis not present

## 2021-10-15 DIAGNOSIS — M25572 Pain in left ankle and joints of left foot: Secondary | ICD-10-CM | POA: Diagnosis not present

## 2021-10-15 DIAGNOSIS — M6281 Muscle weakness (generalized): Secondary | ICD-10-CM | POA: Diagnosis not present

## 2021-10-15 DIAGNOSIS — M7662 Achilles tendinitis, left leg: Secondary | ICD-10-CM | POA: Diagnosis not present

## 2021-10-15 DIAGNOSIS — M19072 Primary osteoarthritis, left ankle and foot: Secondary | ICD-10-CM | POA: Diagnosis not present

## 2021-11-10 ENCOUNTER — Other Ambulatory Visit: Payer: Self-pay | Admitting: Orthopaedic Surgery

## 2021-11-10 ENCOUNTER — Other Ambulatory Visit: Payer: Self-pay

## 2021-11-10 ENCOUNTER — Ambulatory Visit
Admission: RE | Admit: 2021-11-10 | Discharge: 2021-11-10 | Disposition: A | Discharge status: Home / Self Care | Payer: Medicare Other | Source: Ambulatory Visit | Attending: Orthopaedic Surgery | Admitting: Orthopaedic Surgery

## 2021-11-10 DIAGNOSIS — M19072 Primary osteoarthritis, left ankle and foot: Secondary | ICD-10-CM | POA: Diagnosis not present

## 2021-11-10 DIAGNOSIS — M25572 Pain in left ankle and joints of left foot: Secondary | ICD-10-CM

## 2021-11-10 DIAGNOSIS — I739 Peripheral vascular disease, unspecified: Secondary | ICD-10-CM | POA: Diagnosis not present

## 2021-11-22 ENCOUNTER — Other Ambulatory Visit: Payer: Self-pay | Admitting: Orthopaedic Surgery

## 2021-11-22 DIAGNOSIS — M19072 Primary osteoarthritis, left ankle and foot: Secondary | ICD-10-CM | POA: Diagnosis not present

## 2021-12-15 ENCOUNTER — Other Ambulatory Visit: Payer: Self-pay | Admitting: Orthopaedic Surgery

## 2021-12-16 NOTE — Progress Notes (Signed)
Surgical Instructions    Your procedure is scheduled on Tuesday, February 21st, 2023.   Report to [facility] Main Entrance "A" at 07:00 A.M., then check in with the Admitting office.  Call this number if you have problems the morning of surgery:  [phone]   If you have any questions prior to your surgery date call [phone]: Open Monday-Friday 8am-4pm    Remember:  Do not eat after midnight the night before your surgery  You may drink clear liquids until 06:00 the morning of your surgery.   Clear liquids allowed are: Water, Non-Citrus Juices (without pulp), Carbonated Beverages, Clear Tea, Black Coffee ONLY (NO MILK, CREAM OR POWDERED CREAMER of any kind), and Gatorade  Patient Instructions  The night before surgery:  No food after midnight. ONLY clear liquids after midnight  The day of surgery (if you do NOT have diabetes):  Drink ONE (1) Pre-Surgery Clear Ensure by 06:00 the morning of surgery. Drink in one sitting. Do not sip.  This drink was given to you during your hospital  pre-op appointment visit.  Nothing else to drink after completing the  Pre-Surgery Clear Ensure.          If you have questions, please contact your surgeons office.     Take these medicines the morning of surgery with A SIP OF WATER:   allopurinol (ZYLOPRIM)  pantoprazole (PROTONIX)  If needed:  acetaminophen (TYLENOL)  ondansetron (ZOFRAN)  Follow your surgeon's instructions on when to stop Aspirin.  If no instructions were given by your surgeon then you will need to call the office to get those instructions.     As of today, STOP taking any Aspirin (unless otherwise instructed by your surgeon) Aleve, Naproxen, Ibuprofen, Motrin, Advil, Goody's, BC's, all herbal medications, fish oil, and all vitamins.    The day of surgery:          Do not wear jewelry  Do not wear lotions, powders, colognes, or deodorant. Men may shave face and neck. Do not bring valuables to the  hospital.   [facility] is not responsible for any belongings or valuables. .   Do NOT Smoke (Tobacco/Vaping)  24 hours prior to your procedure  If you use a CPAP at night, you may bring your mask for your overnight stay.   Contacts, glasses, hearing aids, dentures or partials may not be worn into surgery, please bring cases for these belongings   For patients admitted to the hospital, discharge time will be determined by your treatment team.   Patients discharged the day of surgery will not be allowed to drive home, and someone needs to stay with them for 24 hours.  NO VISITORS WILL BE ALLOWED IN PRE-OP WHERE PATIENTS ARE PREPPED FOR SURGERY.  ONLY 1 SUPPORT PERSON MAY BE PRESENT IN THE WAITING ROOM WHILE YOU ARE IN SURGERY.  IF YOU ARE TO BE ADMITTED, ONCE YOU ARE IN YOUR ROOM YOU WILL BE ALLOWED TWO (2) VISITORS. 1 (ONE) VISITOR MAY STAY OVERNIGHT BUT MUST ARRIVE TO THE ROOM BY 8pm.  Minor children may have two parents present. Special consideration for safety and communication needs will be reviewed on a case by case basis.  Special instructions:    Oral Hygiene is also important to reduce your risk of infection.  Remember - BRUSH YOUR TEETH THE MORNING OF SURGERY WITH YOUR REGULAR TOOTHPASTE   [facility]- Preparing For Surgery  Before surgery, you can play an important role. Because skin is not sterile, your  skin needs to be as free of germs as possible. You can reduce the number of germs on your skin by washing with CHG (chlorahexidine gluconate) Soap before surgery.  CHG is an antiseptic cleaner which kills germs and bonds with the skin to continue killing germs even after washing.     Please do not use if you have an allergy to CHG or antibacterial soaps. If your skin becomes reddened/irritated stop using the CHG.  Do not shave (including legs and underarms) for at least 48 hours prior to first CHG shower. It is OK to shave your face.  Please follow these instructions  carefully.     Shower the NIGHT BEFORE SURGERY and the MORNING OF SURGERY with CHG Soap.   If you chose to wash your hair, wash your hair first as usual with your normal shampoo. After you shampoo, rinse your hair and body thoroughly to remove the shampoo.  Then [employer] and genitals (private parts) with your normal soap and rinse thoroughly to remove soap.  After that Use CHG Soap as you would any other liquid soap. You can apply CHG directly to the skin and wash gently with a scrungie or a clean washcloth.   Apply the CHG Soap to your body ONLY FROM THE NECK DOWN.  Do not use on open wounds or open sores. Avoid contact with your eyes, ears, mouth and genitals (private parts). Wash Face and genitals (private parts)  with your normal soap.   Wash thoroughly, paying special attention to the area where your surgery will be performed.  Thoroughly rinse your body with warm water from the neck down.  DO NOT shower/wash with your normal soap after using and rinsing off the CHG Soap.  Pat yourself dry with a CLEAN TOWEL.  Wear CLEAN PAJAMAS to bed the night before surgery  Place CLEAN SHEETS on your bed the night before your surgery  DO NOT SLEEP WITH PETS.   Day of Surgery:  Take a shower with CHG soap. Wear Clean/Comfortable clothing the morning of surgery Do not apply any deodorants/lotions.   Remember to brush your teeth WITH YOUR REGULAR TOOTHPASTE.    COVID testing  If you are going to stay overnight or be admitted after your procedure/surgery and require a pre-op COVID test, please follow these instructions after your COVID test   You are not required to quarantine however you are required to wear a well-fitting mask when you are out and around people not in your household.  If your mask becomes wet or soiled, replace with a new one.  Wash your hands often with soap and water for 20 seconds or clean your hands with an alcohol-based hand sanitizer that contains at least 60%  alcohol.  Do not share personal items.  Notify your provider: if you are in close contact with someone who has COVID  or if you develop a fever of 100.4 or greater, sneezing, cough, sore throat, shortness of breath or body aches.    Please read over the following fact sheets that you were given.

## 2021-12-17 ENCOUNTER — Encounter (HOSPITAL_COMMUNITY)
Admission: RE | Admit: 2021-12-17 | Discharge: 2021-12-17 | Disposition: A | Discharge status: Home / Self Care | Payer: Medicare Other | Source: Ambulatory Visit | Attending: Orthopaedic Surgery | Admitting: Orthopaedic Surgery

## 2021-12-17 ENCOUNTER — Encounter (HOSPITAL_COMMUNITY): Payer: Self-pay

## 2021-12-17 ENCOUNTER — Other Ambulatory Visit: Payer: Self-pay

## 2021-12-17 VITALS — BP 115/74 | HR 80 | Temp 97.9°F | Resp 17 | Ht 69.0 in | Wt 185.6 lb

## 2021-12-17 DIAGNOSIS — I10 Essential (primary) hypertension: Secondary | ICD-10-CM | POA: Diagnosis not present

## 2021-12-17 DIAGNOSIS — M19072 Primary osteoarthritis, left ankle and foot: Secondary | ICD-10-CM

## 2021-12-17 DIAGNOSIS — Z01818 Encounter for other preprocedural examination: Secondary | ICD-10-CM | POA: Diagnosis not present

## 2021-12-17 DIAGNOSIS — Z20822 Contact with and (suspected) exposure to covid-19: Secondary | ICD-10-CM | POA: Diagnosis not present

## 2021-12-17 HISTORY — DX: Essential (primary) hypertension: I10

## 2021-12-17 HISTORY — DX: Chronic kidney disease, unspecified: N18.9

## 2021-12-17 LAB — CBC
HCT: 43.8 % (ref 39.0–52.0)
Hemoglobin: 15.6 g/dL (ref 13.0–17.0)
MCH: 31.1 pg (ref 26.0–34.0)
MCHC: 35.6 g/dL (ref 30.0–36.0)
MCV: 87.4 fL (ref 80.0–100.0)
Platelets: 265 10*3/uL (ref 150–400)
RBC: 5.01 MIL/uL (ref 4.22–5.81)
RDW: 13.1 % (ref 11.5–15.5)
WBC: 7.3 10*3/uL (ref 4.0–10.5)
nRBC: 0 % (ref 0.0–0.2)

## 2021-12-17 LAB — BASIC METABOLIC PANEL
Anion gap: 9 (ref 5–15)
BUN: 10 mg/dL (ref 8–23)
CO2: 31 mmol/L (ref 22–32)
Calcium: 9.3 mg/dL (ref 8.9–10.3)
Chloride: 90 mmol/L — ABNORMAL LOW (ref 98–111)
Creatinine, Ser: 1.05 mg/dL (ref 0.61–1.24)
GFR, Estimated: >60 mL/min (ref >60)
Glucose, Bld: 109 mg/dL — ABNORMAL HIGH (ref 70–99)
Potassium: 3.4 mmol/L — ABNORMAL LOW (ref 3.5–5.1)
Sodium: 130 mmol/L — ABNORMAL LOW (ref 135–145)

## 2021-12-17 LAB — SURGICAL PCR SCREEN
MRSA, PCR: NEGATIVE
Staphylococcus aureus: NEGATIVE

## 2021-12-17 LAB — SARS CORONAVIRUS 2 (TAT 6-24 HRS): SARS Coronavirus 2: NEGATIVE

## 2021-12-17 NOTE — Progress Notes (Signed)
PCP - Dr. Philmore Pali at [facility] in [place] was seeing Leonie Douglas, MD Cardiologist - Denies  Chest x-ray - Not indicated EKG - 07/12/21 Stress Test - 03/02/17 ECHO - Denies Cardiac Cath - Denies  Sleep Study - Denies  DM - Denies  Aspirin Instructions:Will call surgeon for instructions.   ERAS Protcol - yes PRE-SURGERY Ensure    COVID TEST- 12/17/21   Anesthesia review: No  Patient denies shortness of breath, fever, cough and chest pain at PAT appointment   All instructions explained to the patient, with a verbal understanding of the material. Patient agrees to go over the instructions while at home for a better understanding. Patient also instructed to wear a mask while in public after being tested for COVID-19. The opportunity to ask questions was provided.

## 2021-12-21 ENCOUNTER — Ambulatory Visit (HOSPITAL_BASED_OUTPATIENT_CLINIC_OR_DEPARTMENT_OTHER): Payer: Medicare Other | Admitting: Anesthesiology

## 2021-12-21 ENCOUNTER — Ambulatory Visit (HOSPITAL_COMMUNITY)
Admission: RE | Admit: 2021-12-21 | Discharge: 2021-12-22 | Disposition: A | Discharge status: Home / Self Care | Payer: Medicare Other | Attending: Orthopaedic Surgery | Admitting: Orthopaedic Surgery

## 2021-12-21 ENCOUNTER — Ambulatory Visit (HOSPITAL_COMMUNITY): Payer: Medicare Other

## 2021-12-21 ENCOUNTER — Ambulatory Visit (HOSPITAL_COMMUNITY): Payer: Medicare Other | Admitting: Anesthesiology

## 2021-12-21 ENCOUNTER — Encounter (HOSPITAL_COMMUNITY): Payer: Self-pay | Admitting: Orthopaedic Surgery

## 2021-12-21 ENCOUNTER — Other Ambulatory Visit: Payer: Self-pay

## 2021-12-21 ENCOUNTER — Telehealth: Payer: Self-pay | Admitting: *Deleted

## 2021-12-21 ENCOUNTER — Encounter (HOSPITAL_COMMUNITY)
Admission: RE | Disposition: A | Discharge status: Home / Self Care | Payer: Self-pay | Source: Home / Self Care | Attending: Orthopaedic Surgery

## 2021-12-21 DIAGNOSIS — M19072 Primary osteoarthritis, left ankle and foot: Secondary | ICD-10-CM | POA: Diagnosis not present

## 2021-12-21 DIAGNOSIS — N182 Chronic kidney disease, stage 2 (mild): Secondary | ICD-10-CM | POA: Diagnosis not present

## 2021-12-21 DIAGNOSIS — M25772 Osteophyte, left ankle: Secondary | ICD-10-CM | POA: Insufficient documentation

## 2021-12-21 DIAGNOSIS — I129 Hypertensive chronic kidney disease with stage 1 through stage 4 chronic kidney disease, or unspecified chronic kidney disease: Secondary | ICD-10-CM | POA: Diagnosis not present

## 2021-12-21 DIAGNOSIS — Z96662 Presence of left artificial ankle joint: Secondary | ICD-10-CM | POA: Diagnosis not present

## 2021-12-21 DIAGNOSIS — M19079 Primary osteoarthritis, unspecified ankle and foot: Secondary | ICD-10-CM | POA: Diagnosis present

## 2021-12-21 DIAGNOSIS — Z471 Aftercare following joint replacement surgery: Secondary | ICD-10-CM | POA: Diagnosis not present

## 2021-12-21 DIAGNOSIS — G8918 Other acute postprocedural pain: Secondary | ICD-10-CM | POA: Diagnosis not present

## 2021-12-21 DIAGNOSIS — Z96642 Presence of left artificial hip joint: Secondary | ICD-10-CM | POA: Diagnosis not present

## 2021-12-21 DIAGNOSIS — Z419 Encounter for procedure for purposes other than remedying health state, unspecified: Secondary | ICD-10-CM

## 2021-12-21 DIAGNOSIS — M85672 Other cyst of bone, left ankle and foot: Secondary | ICD-10-CM

## 2021-12-21 DIAGNOSIS — K219 Gastro-esophageal reflux disease without esophagitis: Secondary | ICD-10-CM | POA: Insufficient documentation

## 2021-12-21 HISTORY — PX: TOTAL ANKLE ARTHROPLASTY: SHX811

## 2021-12-21 LAB — CBC
HCT: 39.6 % (ref 39.0–52.0)
Hemoglobin: 14.2 g/dL (ref 13.0–17.0)
MCH: 31.1 pg (ref 26.0–34.0)
MCHC: 35.9 g/dL (ref 30.0–36.0)
MCV: 86.8 fL (ref 80.0–100.0)
Platelets: 241 10*3/uL (ref 150–400)
RBC: 4.56 MIL/uL (ref 4.22–5.81)
RDW: 13.1 % (ref 11.5–15.5)
WBC: 9.3 10*3/uL (ref 4.0–10.5)
nRBC: 0 % (ref 0.0–0.2)

## 2021-12-21 LAB — CREATININE, SERUM
Creatinine, Ser: 1.26 mg/dL — ABNORMAL HIGH (ref 0.61–1.24)
GFR, Estimated: >60 mL/min (ref >60)

## 2021-12-21 SURGERY — ARTHROPLASTY, ANKLE, TOTAL
Anesthesia: General | Site: Ankle | Laterality: Left

## 2021-12-21 MED ORDER — LIDOCAINE 2% (20 MG/ML) 5 ML SYRINGE
INTRAMUSCULAR | Status: DC | PRN
  Administered 2021-12-21: 100 mg via INTRAVENOUS

## 2021-12-21 MED ORDER — PROPOFOL 10 MG/ML IV BOLUS
INTRAVENOUS | Status: AC
  Filled 2021-12-21: qty 20

## 2021-12-21 MED ORDER — ONDANSETRON HCL 4 MG/2ML IJ SOLN: 4.0000 mg | Freq: Once | INTRAMUSCULAR | Status: DC | PRN

## 2021-12-21 MED ORDER — ONDANSETRON HCL 4 MG PO TABS
4.0000 mg | ORAL_TABLET | Freq: Four times a day (QID) | ORAL | Status: DC | PRN
Start: 2021-12-21 — End: 2021-12-22

## 2021-12-21 MED ORDER — ACETAMINOPHEN 500 MG PO TABS
1000.0000 mg | ORAL_TABLET | Freq: Once | ORAL | Status: AC
  Administered 2021-12-21: 1000 mg via ORAL
  Filled 2021-12-21: qty 2

## 2021-12-21 MED ORDER — ASPIRIN 325 MG PO TABS
325.0000 mg | ORAL_TABLET | Freq: Every day | ORAL | 0 refills | Status: AC
Start: 2021-12-21 — End: 2022-01-20

## 2021-12-21 MED ORDER — METOCLOPRAMIDE HCL 5 MG/ML IJ SOLN: 5.0000 mg | Freq: Three times a day (TID) | INTRAMUSCULAR | Status: DC | PRN

## 2021-12-21 MED ORDER — SIMVASTATIN 20 MG PO TABS
20.0000 mg | ORAL_TABLET | Freq: Every day | ORAL | Status: DC
  Administered 2021-12-21: 20 mg via ORAL
  Filled 2021-12-21: qty 1

## 2021-12-21 MED ORDER — FENTANYL CITRATE (PF) 250 MCG/5ML IJ SOLN
INTRAMUSCULAR | Status: DC | PRN
Start: 2021-12-21 — End: 2021-12-21
  Administered 2021-12-21: 20 ug via INTRAVENOUS
  Administered 2021-12-21: 30 ug via INTRAVENOUS
  Administered 2021-12-21 (×5): 20 ug via INTRAVENOUS

## 2021-12-21 MED ORDER — DIPHENHYDRAMINE HCL 12.5 MG/5ML PO ELIX: 12.5000 mg | ORAL_SOLUTION | ORAL | Status: DC | PRN

## 2021-12-21 MED ORDER — OXYCODONE HCL 5 MG PO TABS: 10.0000 mg | ORAL_TABLET | ORAL | Status: DC | PRN

## 2021-12-21 MED ORDER — FENTANYL CITRATE (PF) 100 MCG/2ML IJ SOLN: 50.0000 ug | Freq: Once | INTRAMUSCULAR | Status: AC

## 2021-12-21 MED ORDER — OXYCODONE HCL 5 MG PO TABS: 5.0000 mg | ORAL_TABLET | ORAL | 0 refills | Status: AC | PRN

## 2021-12-21 MED ORDER — ACETAMINOPHEN 500 MG PO TABS
1000.0000 mg | ORAL_TABLET | Freq: Four times a day (QID) | ORAL | Status: AC
  Administered 2021-12-21 – 2021-12-22 (×4): 1000 mg via ORAL
  Filled 2021-12-21 (×4): qty 2

## 2021-12-21 MED ORDER — DEXAMETHASONE SODIUM PHOSPHATE 10 MG/ML IJ SOLN
INTRAMUSCULAR | Status: DC | PRN
  Administered 2021-12-21: 5 mg

## 2021-12-21 MED ORDER — FENTANYL CITRATE (PF) 100 MCG/2ML IJ SOLN
INTRAMUSCULAR | Status: AC
  Administered 2021-12-21: 50 ug via INTRAVENOUS
  Filled 2021-12-21: qty 2

## 2021-12-21 MED ORDER — LACTATED RINGERS IV SOLN
INTRAVENOUS | Status: DC
Start: 2021-12-21 — End: 2021-12-21

## 2021-12-21 MED ORDER — PHENYLEPHRINE 40 MCG/ML (10ML) SYRINGE FOR IV PUSH (FOR BLOOD PRESSURE SUPPORT)
PREFILLED_SYRINGE | INTRAVENOUS | Status: DC | PRN
  Administered 2021-12-21 (×5): 40 ug via INTRAVENOUS
  Administered 2021-12-21: 80 ug via INTRAVENOUS

## 2021-12-21 MED ORDER — CEFAZOLIN SODIUM-DEXTROSE 1-4 GM/50ML-% IV SOLN
1.0000 g | Freq: Four times a day (QID) | INTRAVENOUS | Status: AC
  Administered 2021-12-21 – 2021-12-22 (×3): 1 g via INTRAVENOUS
  Filled 2021-12-21 (×3): qty 50

## 2021-12-21 MED ORDER — PANTOPRAZOLE SODIUM 40 MG PO TBEC
40.0000 mg | DELAYED_RELEASE_TABLET | Freq: Every day | ORAL | Status: DC
  Administered 2021-12-21 – 2021-12-22 (×2): 40 mg via ORAL
  Filled 2021-12-21 (×2): qty 1

## 2021-12-21 MED ORDER — ACETAMINOPHEN 325 MG PO TABS: 325.0000 mg | ORAL_TABLET | Freq: Four times a day (QID) | ORAL | Status: DC | PRN

## 2021-12-21 MED ORDER — MIDAZOLAM HCL 2 MG/2ML IJ SOLN
INTRAMUSCULAR | Status: AC
  Administered 2021-12-21: 1 mg via INTRAVENOUS
  Filled 2021-12-21: qty 2

## 2021-12-21 MED ORDER — 0.9 % SODIUM CHLORIDE (POUR BTL) OPTIME
TOPICAL | Status: DC | PRN
  Administered 2021-12-21: 1000 mL

## 2021-12-21 MED ORDER — ONDANSETRON HCL 4 MG/2ML IJ SOLN: 4.0000 mg | Freq: Four times a day (QID) | INTRAMUSCULAR | Status: DC | PRN

## 2021-12-21 MED ORDER — ONDANSETRON HCL 4 MG/2ML IJ SOLN
INTRAMUSCULAR | Status: DC | PRN
  Administered 2021-12-21: 4 mg via INTRAVENOUS

## 2021-12-21 MED ORDER — FENTANYL CITRATE (PF) 250 MCG/5ML IJ SOLN
INTRAMUSCULAR | Status: AC
  Filled 2021-12-21: qty 5

## 2021-12-21 MED ORDER — METHOCARBAMOL 500 MG PO TABS: 500.0000 mg | ORAL_TABLET | Freq: Four times a day (QID) | ORAL | Status: DC | PRN

## 2021-12-21 MED ORDER — MIDAZOLAM HCL 2 MG/2ML IJ SOLN: 1.0000 mg | Freq: Once | INTRAMUSCULAR | Status: AC

## 2021-12-21 MED ORDER — DEXAMETHASONE SODIUM PHOSPHATE 10 MG/ML IJ SOLN
INTRAMUSCULAR | Status: DC | PRN
  Administered 2021-12-21: 10 mg via INTRAVENOUS

## 2021-12-21 MED ORDER — LIDOCAINE 2% (20 MG/ML) 5 ML SYRINGE
INTRAMUSCULAR | Status: AC
  Filled 2021-12-21: qty 5

## 2021-12-21 MED ORDER — HYDROMORPHONE HCL 1 MG/ML IJ SOLN: 0.5000 mg | INTRAMUSCULAR | Status: DC | PRN

## 2021-12-21 MED ORDER — LISINOPRIL 5 MG PO TABS
5.0000 mg | ORAL_TABLET | Freq: Every day | ORAL | Status: DC
  Administered 2021-12-21 – 2021-12-22 (×2): 5 mg via ORAL
  Filled 2021-12-21 (×2): qty 1

## 2021-12-21 MED ORDER — VANCOMYCIN HCL 500 MG IV SOLR
INTRAVENOUS | Status: DC | PRN
  Administered 2021-12-21: 500 mg via TOPICAL

## 2021-12-21 MED ORDER — CHLORHEXIDINE GLUCONATE 0.12 % MT SOLN
15.0000 mL | Freq: Once | OROMUCOSAL | Status: AC
  Administered 2021-12-21: 15 mL via OROMUCOSAL
  Filled 2021-12-21: qty 15

## 2021-12-21 MED ORDER — PROPOFOL 10 MG/ML IV BOLUS
INTRAVENOUS | Status: DC | PRN
  Administered 2021-12-21: 150 mg via INTRAVENOUS

## 2021-12-21 MED ORDER — ORAL CARE MOUTH RINSE: 15.0000 mL | Freq: Once | OROMUCOSAL | Status: AC

## 2021-12-21 MED ORDER — METOCLOPRAMIDE HCL 5 MG PO TABS: 5.0000 mg | ORAL_TABLET | Freq: Three times a day (TID) | ORAL | Status: DC | PRN

## 2021-12-21 MED ORDER — AMISULPRIDE (ANTIEMETIC) 5 MG/2ML IV SOLN: 10.0000 mg | Freq: Once | INTRAVENOUS | Status: DC | PRN

## 2021-12-21 MED ORDER — OXYCODONE HCL 5 MG/5ML PO SOLN: 5.0000 mg | Freq: Once | ORAL | Status: DC | PRN

## 2021-12-21 MED ORDER — FENTANYL CITRATE (PF) 100 MCG/2ML IJ SOLN: 25.0000 ug | INTRAMUSCULAR | Status: DC | PRN

## 2021-12-21 MED ORDER — DOCUSATE SODIUM 100 MG PO CAPS
100.0000 mg | ORAL_CAPSULE | Freq: Two times a day (BID) | ORAL | Status: DC
  Administered 2021-12-21 – 2021-12-22 (×3): 100 mg via ORAL
  Filled 2021-12-21 (×3): qty 1

## 2021-12-21 MED ORDER — ENOXAPARIN SODIUM 40 MG/0.4ML IJ SOSY
40.0000 mg | PREFILLED_SYRINGE | INTRAMUSCULAR | Status: DC
  Administered 2021-12-22: 40 mg via SUBCUTANEOUS
  Filled 2021-12-21: qty 0.4

## 2021-12-21 MED ORDER — VANCOMYCIN HCL 500 MG IV SOLR
INTRAVENOUS | Status: AC
  Filled 2021-12-21: qty 10

## 2021-12-21 MED ORDER — ALLOPURINOL 300 MG PO TABS
300.0000 mg | ORAL_TABLET | Freq: Every day | ORAL | Status: DC
  Administered 2021-12-21 – 2021-12-22 (×2): 300 mg via ORAL
  Filled 2021-12-21 (×2): qty 1

## 2021-12-21 MED ORDER — OXYCODONE HCL 5 MG PO TABS: 5.0000 mg | ORAL_TABLET | Freq: Once | ORAL | Status: DC | PRN

## 2021-12-21 MED ORDER — ROPIVACAINE HCL 5 MG/ML IJ SOLN
INTRAMUSCULAR | Status: DC | PRN
Start: 2021-12-21 — End: 2021-12-21
  Administered 2021-12-21: 15 mL via PERINEURAL
  Administered 2021-12-21: 25 mL via PERINEURAL

## 2021-12-21 MED ORDER — NAPROXEN 250 MG PO TABS
250.0000 mg | ORAL_TABLET | Freq: Two times a day (BID) | ORAL | Status: DC
  Administered 2021-12-21 – 2021-12-22 (×2): 250 mg via ORAL
  Filled 2021-12-21 (×2): qty 1

## 2021-12-21 MED ORDER — METHOCARBAMOL 1000 MG/10ML IJ SOLN
500.0000 mg | Freq: Four times a day (QID) | INTRAVENOUS | Status: DC | PRN
  Filled 2021-12-21: qty 5

## 2021-12-21 MED ORDER — CEFAZOLIN SODIUM-DEXTROSE 2-4 GM/100ML-% IV SOLN
2.0000 g | INTRAVENOUS | Status: AC
  Administered 2021-12-21: 2 g via INTRAVENOUS
  Filled 2021-12-21: qty 100

## 2021-12-21 MED ORDER — PHENYLEPHRINE HCL-NACL 20-0.9 MG/250ML-% IV SOLN
INTRAVENOUS | Status: DC | PRN
  Administered 2021-12-21: 20 ug/min via INTRAVENOUS

## 2021-12-21 MED ORDER — OXYCODONE HCL 5 MG PO TABS
5.0000 mg | ORAL_TABLET | ORAL | Status: DC | PRN
  Filled 2021-12-21: qty 2

## 2021-12-21 SURGICAL SUPPLY — 68 items
BAG COUNTER SPONGE SURGICOUNT (BAG) ×2 IMPLANT
BANDAGE ESMARK 6X9 LF (GAUZE/BANDAGES/DRESSINGS) ×1 IMPLANT
BLADE RECIPRO TAPERED (BLADE) ×2 IMPLANT
BLADE SAW OSC ANKLE 8X63X1.19 (BLADE) ×1 IMPLANT
BLADE SAW RECIP ANKLE 8X50X1 (PIN) ×1 IMPLANT
BLADE SURG 15 STRL LF DISP TIS (BLADE) ×2 IMPLANT
BLADE SURG 15 STRL SS (BLADE) ×4
BNDG ELASTIC 6X5.8 VLCR STR LF (GAUZE/BANDAGES/DRESSINGS) ×1 IMPLANT
BNDG ESMARK 6X9 LF (GAUZE/BANDAGES/DRESSINGS)
CHLORAPREP W/TINT 26 (MISCELLANEOUS) ×2 IMPLANT
CLIP LOCKING ANKLE SZ3 (Clip) ×1 IMPLANT
COVER SURGICAL LIGHT HANDLE (MISCELLANEOUS) ×2 IMPLANT
CUFF TOURN SGL QUICK 34 (TOURNIQUET CUFF) ×2
CUFF TRNQT CYL 34X4.125X (TOURNIQUET CUFF) ×1 IMPLANT
DRAPE C-ARM 42X72 X-RAY (DRAPES) ×2 IMPLANT
DRAPE C-ARMOR (DRAPES) ×2 IMPLANT
DRAPE HALF SHEET 40X57 (DRAPES) IMPLANT
DRAPE U-SHAPE 47X51 STRL (DRAPES) ×2 IMPLANT
DRSG MEPILEX BORDER 4X4 (GAUZE/BANDAGES/DRESSINGS) ×1 IMPLANT
DRSG PAD ABDOMINAL 8X10 ST (GAUZE/BANDAGES/DRESSINGS) ×2 IMPLANT
DRSG XEROFORM 1X8 (GAUZE/BANDAGES/DRESSINGS) ×1 IMPLANT
ELECT REM PT RETURN 9FT ADLT (ELECTROSURGICAL) ×2
ELECTRODE REM PT RTRN 9FT ADLT (ELECTROSURGICAL) ×1 IMPLANT
FACESHIELD WRAPAROUND (MASK) IMPLANT
FACESHIELD WRAPAROUND OR TEAM (MASK) IMPLANT
GAUZE SPONGE 4X4 12PLY STRL (GAUZE/BANDAGES/DRESSINGS) ×2 IMPLANT
GAUZE SPONGE 4X4 12PLY STRL LF (GAUZE/BANDAGES/DRESSINGS) ×1 IMPLANT
GAUZE XEROFORM 5X9 LF (GAUZE/BANDAGES/DRESSINGS) ×1 IMPLANT
GLOVE SRG 8 PF TXTR STRL LF DI (GLOVE) ×2 IMPLANT
GLOVE SURG ENC MOIS LTX SZ8 (GLOVE) ×2 IMPLANT
GLOVE SURG ENC TEXT LTX SZ7.5 (GLOVE) ×2 IMPLANT
GLOVE SURG UNDER POLY LF SZ8 (GLOVE) ×4
GOWN STRL REUS W/ TWL LRG LVL3 (GOWN DISPOSABLE) ×1 IMPLANT
GOWN STRL REUS W/ TWL XL LVL3 (GOWN DISPOSABLE) ×2 IMPLANT
GOWN STRL REUS W/TWL LRG LVL3 (GOWN DISPOSABLE) ×2
GOWN STRL REUS W/TWL XL LVL3 (GOWN DISPOSABLE) ×4
IMPL TALAR ANKLE SZ 4 LT (Ankle) IMPLANT
IMPLANT TALAR ANKLE SZ 4 LT (Ankle) ×2 IMPLANT
INSERT TIB FB ANKLE SZ 4X8 LT (Ankle) ×1 IMPLANT
KIT BASIN OR (CUSTOM PROCEDURE TRAY) ×2 IMPLANT
KIT TURNOVER KIT B (KITS) ×2 IMPLANT
NS IRRIG 1000ML POUR BTL (IV SOLUTION) ×2 IMPLANT
PACK ORTHO EXTREMITY (CUSTOM PROCEDURE TRAY) ×2 IMPLANT
PAD ARMBOARD 7.5X6 YLW CONV (MISCELLANEOUS) ×4 IMPLANT
PAD CAST 4YDX4 CTTN HI CHSV (CAST SUPPLIES) ×3 IMPLANT
PADDING CAST COTTON 4X4 STRL (CAST SUPPLIES) ×2
PADDING CAST COTTON 6X4 STRL (CAST SUPPLIES) ×2 IMPLANT
PIN POUCH TALAR VANTAGE 2.0 (PIN) ×1 IMPLANT
PIN POUCH TALAR VANTAGE 2.5 (PIN) ×1 IMPLANT
PIN POUCH TALAR VANTAGE 3.5 (PIN) ×2 IMPLANT
PLATE TIB FB ANKLE SZ 3 LT (Ankle) ×1 IMPLANT
POUCH SCREW TALAR ANKLE (ORTHOPEDIC DISPOSABLE SUPPLIES) ×1 IMPLANT
SPLINT PLASTER CAST XFAST 5X30 (CAST SUPPLIES) IMPLANT
SPLINT PLASTER XFAST SET 5X30 (CAST SUPPLIES) ×1
SPONGE T-LAP 18X18 ~~LOC~~+RFID (SPONGE) ×1 IMPLANT
SUCTION FRAZIER HANDLE 10FR (MISCELLANEOUS)
SUCTION TUBE FRAZIER 10FR DISP (MISCELLANEOUS) IMPLANT
SURGILUBE 2OZ TUBE FLIPTOP (MISCELLANEOUS) ×1 IMPLANT
SUT ETHILON 3 0 PS 1 (SUTURE) ×3 IMPLANT
SUT MON AB 2-0 CT1 36 (SUTURE) ×1 IMPLANT
SUT MON AB 3-0 SH 27 (SUTURE) ×2
SUT MON AB 3-0 SH27 (SUTURE) ×2 IMPLANT
SUT VIC AB 0 CT1 27 (SUTURE) ×4
SUT VIC AB 0 CT1 27XBRD ANBCTR (SUTURE) ×2 IMPLANT
TOWEL GREEN STERILE (TOWEL DISPOSABLE) ×2 IMPLANT
TOWEL GREEN STERILE FF (TOWEL DISPOSABLE) ×2 IMPLANT
TUBE CONNECTING 12X1/4 (SUCTIONS) ×2 IMPLANT
YANKAUER SUCT BULB TIP NO VENT (SUCTIONS) ×2 IMPLANT

## 2021-12-21 NOTE — Plan of Care (Signed)

## 2021-12-21 NOTE — Anesthesia Postprocedure Evaluation (Signed)
Anesthesia Post Note  Patient: [name]  Procedure(s) Performed: LEFT TOTAL ANKLE ARTHROPLASTY, PROXIMAL TIBIAL BONE GRAFT HARVEST (Left: Ankle)     Patient location during evaluation: PACU Anesthesia Type: General Level of consciousness: awake and alert Pain management: pain level controlled Vital Signs Assessment: post-procedure vital signs reviewed and stable Respiratory status: spontaneous breathing, nonlabored ventilation and respiratory function stable Cardiovascular status: blood pressure returned to baseline and stable Postop Assessment: no apparent nausea or vomiting Anesthetic complications: no   No notable events documented.  Last Vitals:  Vitals:   12/21/21 1230 12/21/21 1245  BP: 97/64 106/67  Pulse: 84 79  Resp: 15 17  Temp:  36.7 C  SpO2: 91% 96%    Last Pain:  Vitals:   12/21/21 1245  TempSrc:   PainSc: 0-No pain                 Lidia Collum

## 2021-12-21 NOTE — Transfer of Care (Signed)
Immediate Anesthesia Transfer of Care Note  Patient: [name]  Procedure(s) Performed: LEFT TOTAL ANKLE ARTHROPLASTY, PROXIMAL TIBIAL BONE GRAFT HARVEST (Left: Ankle)  Patient Location: PACU  Anesthesia Type:GA combined with regional for post-op pain  Level of Consciousness: awake, alert , oriented and drowsy  Airway & Oxygen Therapy: Patient Spontanous Breathing and Patient connected to nasal cannula oxygen  Post-op Assessment: Report given to RN, Post -op Vital signs reviewed and stable and Patient moving all extremities X 4  Post vital signs: Reviewed and stable  Last Vitals:  Vitals Value Taken Time  BP    Temp    Pulse 84 12/21/21 1217  Resp 2 12/21/21 1217  SpO2 93 % 12/21/21 1217  Vitals shown include unvalidated device data.  Last Pain:  Vitals:   12/21/21 0850  TempSrc:   PainSc: 0-No pain         Complications: No notable events documented.

## 2021-12-21 NOTE — Anesthesia Procedure Notes (Signed)
Anesthesia Regional Block: Popliteal block   Pre-Anesthetic Checklist: , timeout performed,  Correct Patient, Correct Site, Correct Laterality,  Correct Procedure, Correct Position, site marked,  Risks and benefits discussed,  Surgical consent,  Pre-op evaluation,  At surgeon's request and post-op pain management  Laterality: Left  Prep: chloraprep       Needles:  Injection technique: Single-shot  Needle Type: Echogenic Stimulator Needle     Needle Length: 10cm  Needle Gauge: 20     Additional Needles:   Procedures:,,,, ultrasound used (permanent image in chart),,    Narrative:  Start time: 12/21/2021 8:39 AM End time: 12/21/2021 8:42 AM  Performed by: Personally  Anesthesiologist: Lidia Collum, MD  Additional Notes: Standard monitors applied. Skin prepped. Good needle visualization with ultrasound. Injection made in 5cc increments with no resistance to injection. Patient tolerated the procedure well.

## 2021-12-21 NOTE — Progress Notes (Signed)
OT Cancellation Note  Patient Details Name: [name] MRN: [id number] DOB: 1951/06/15   Cancelled Treatment:    Reason Eval/Treat Not Completed: Other (comment) Pt s/p surgery today. Will follow up in the am.  [place], OT/L   Acute OT Clinical Specialist Acute Rehabilitation Services Pager [phone] Office [phone]  12/21/2021, 3:42 PM

## 2021-12-21 NOTE — Brief Op Note (Signed)
12/21/2021  11:42 AM  PATIENT:  [name]  71 y.o. male  PRE-OPERATIVE DIAGNOSIS:  LEFT ANKLE OSTEOARTHRITIS, TIBIAL AND TALAR BONE CYSTS  POST-OPERATIVE DIAGNOSIS:  LEFT ANKLE OSTEOARTHRITIS, TIBIAL AND TALAR BONE   PROCEDURE: Left total ankle arthroplasty  SURGEON:  Surgeon(s) and Role:    Erle Crocker, MD - Primary  PHYSICIAN ASSISTANT: Jesse Martinique, PA-C  ASSISTANTS: none   ANESTHESIA:   general with peripheral nerve block  EBL:  10 mL   BLOOD ADMINISTERED:none  DRAINS: none   LOCAL MEDICATIONS USED:  NONE  SPECIMEN:  No Specimen  DISPOSITION OF SPECIMEN:  N/A  COUNTS:  YES  TOURNIQUET:   Total Tourniquet Time Documented: Thigh (Left) - 122 minutes Total: Thigh (Left) - 122 minutes   DICTATION: .Viviann Spare Dictation  PLAN OF CARE: Admit for overnight observation  PATIENT DISPOSITION:  PACU - hemodynamically stable.   Delay start of Pharmacological VTE agent (>24hrs) due to surgical blood loss or risk of bleeding: yes

## 2021-12-21 NOTE — Anesthesia Procedure Notes (Signed)
Procedure Name: LMA Insertion Date/Time: 12/21/2021 9:15 AM Performed by: Maude Leriche, CRNA Pre-anesthesia Checklist: Patient identified, Emergency Drugs available, Suction available, Patient being monitored and Timeout performed Patient Re-evaluated:Patient Re-evaluated prior to induction Oxygen Delivery Method: Circle system utilized Preoxygenation: Pre-oxygenation with 100% oxygen Induction Type: IV induction LMA: LMA inserted LMA Size: 4.0 Number of attempts: 1 Tube secured with: Tape Dental Injury: Teeth and Oropharynx as per pre-operative assessment

## 2021-12-21 NOTE — Discharge Instructions (Signed)

## 2021-12-21 NOTE — Telephone Encounter (Signed)
Patient is calling to let Dr Jacqualyn Posey know that he had his surgery this morning for a new ankle replacement.  He wanted to thank him for all that he has done, will be in touch and is giving a reference to all the people he knows.

## 2021-12-21 NOTE — Plan of Care (Signed)

## 2021-12-21 NOTE — Anesthesia Preprocedure Evaluation (Addendum)
Anesthesia Evaluation  Patient identified by MRN, date of birth, ID band Patient awake    Reviewed: Allergy & Precautions, NPO status , Patient's Chart, lab work & pertinent test results  History of Anesthesia Complications Negative for: history of anesthetic complications  Airway Mallampati: II  TM Distance: >3 FB Neck ROM: Full    Dental  (+) Dental Advisory Given, Teeth Intact   Pulmonary neg pulmonary ROS,    Pulmonary exam normal        Cardiovascular hypertension, Normal cardiovascular exam   ETT 2018: No signs of underlying CAD, achieved 89% maximal heart rate with no EKG changes. Normal stress treadmill.   Neuro/Psych negative neurological ROS     GI/Hepatic Neg liver ROS, GERD  ,  Endo/Other  negative endocrine ROS  Renal/GU Renal InsufficiencyRenal disease (CKDII)  negative genitourinary   Musculoskeletal  (+) Arthritis ,   Abdominal   Peds  Hematology negative hematology ROS (+) Remote hx non Hodgkin's lymphoma   Anesthesia Other Findings   Reproductive/Obstetrics                           Anesthesia Physical Anesthesia Plan  ASA: 2  Anesthesia Plan: General   Post-op Pain Management: Regional block* and Tylenol PO (pre-op)*   Induction: Intravenous  PONV Risk Score and Plan: 2 and Ondansetron, Dexamethasone, Midazolam and Treatment may vary due to age or medical condition  Airway Management Planned: LMA  Additional Equipment: None  Intra-op Plan:   Post-operative Plan: Extubation in OR  Informed Consent: I have reviewed the patients History and Physical, chart, labs and discussed the procedure including the risks, benefits and alternatives for the proposed anesthesia with the patient or authorized representative who has indicated his/her understanding and acceptance.     Dental advisory given  Plan Discussed with:   Anesthesia Plan Comments:         Anesthesia Quick Evaluation

## 2021-12-21 NOTE — Anesthesia Procedure Notes (Signed)
Anesthesia Regional Block: Adductor canal block   Pre-Anesthetic Checklist: , timeout performed,  Correct Patient, Correct Site, Correct Laterality,  Correct Procedure, Correct Position, site marked,  Risks and benefits discussed,  Surgical consent,  Pre-op evaluation,  At surgeon's request and post-op pain management  Laterality: Left  Prep: chloraprep       Needles:  Injection technique: Single-shot  Needle Type: Echogenic Stimulator Needle     Needle Length: 10cm  Needle Gauge: 20     Additional Needles:   Procedures:,,,, ultrasound used (permanent image in chart),,    Narrative:  Start time: 12/21/2021 8:35 AM End time: 12/21/2021 8:39 AM Injection made incrementally with aspirations every 5 mL.  Performed by: Personally  Anesthesiologist: Lidia Collum, MD  Additional Notes: Standard monitors applied. Skin prepped. Good needle visualization with ultrasound. Injection made in 5cc increments with no resistance to injection. Patient tolerated the procedure well.

## 2021-12-21 NOTE — H&P (Signed)
PREOPERATIVE H&P  Chief Complaint: Left ankle arthritis  HPI: [name] is a 71 y.o. male who presents for preoperative history and physical with a diagnosis of left ankle arthritis.  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  He had symmetric ankle overall.  He is here today for ankle arthroplasty.. Symptoms are rated as moderate to severe, and have been worsening.  This is significantly impairing activities of daily living.  He has elected for surgical management.   Past Medical History:  Diagnosis Date   Arthritis    "knees, fingers, elbows, toes" (02/19/2018)   Chronic kidney disease    Degenerative scoliosis 2022   Dyslipidemia    GERD (gastroesophageal reflux disease)    Gout    "take RX daily" (02/19/2018)   Hypertension    Non Hodgkin's lymphoma, stage IIA ([place]) 1998   non hodgkins lymphoma stage II   Primary localized osteoarthrosis of the knee, right    Tinnitus of left ear    Past Surgical History:  Procedure Laterality Date   BIOPSY  12/21/2020   Procedure: BIOPSY;  Surgeon: Eloise Harman, DO;  [facility]: [facility];  Service: Endoscopy;;  ileocecal valve biopsy   COLONOSCOPY     COLONOSCOPY WITH PROPOFOL N/A 12/21/2020   Procedure: COLONOSCOPY WITH PROPOFOL;  Surgeon: Eloise Harman, DO;  Location: [facility];  Service: Endoscopy;  Laterality: N/A;  12:00   JOINT REPLACEMENT     KNEE ARTHROSCOPY Right 10/31/06   MOUTH SURGERY     "related to replacing crown; reduced the pedistal"   POLYPECTOMY  12/21/2020   Procedure: POLYPECTOMY INTESTINAL;  Surgeon: Eloise Harman, DO;  Location: [facility];  Service: Endoscopy;;  cecal colon polyp; sigmoid colon polyp;    TOE SURGERY Bilateral    Great toe each foot from athletic injuries; "ground the top of the bone off"   TOTAL KNEE ARTHROPLASTY Right 02/19/2018   TOTAL KNEE ARTHROPLASTY Right 02/19/2018   Procedure: TOTAL KNEE ARTHROPLASTY;  Surgeon: Elsie Saas, MD;   Location: [facility];  Service: Orthopedics;  Laterality: Right;   Social History   Socioeconomic History   Marital status: Married    Spouse name: Carmela   Number of children: 2   Years of education: 16   Highest education level: Bachelor's degree (e.g., BA, AB, BS)  Occupational History   Occupation: retired  Tobacco Use   Smoking status: Never   Smokeless tobacco: Never  Vaping Use   Vaping Use: Never used  Substance and Sexual Activity   Alcohol use: Not Currently    Comment: Occasional   Drug use: No   Sexual activity: Yes  Other Topics Concern   Not on file  Social History Narrative   Not on file   Social Determinants of Health   Financial Resource Strain: Not on file  Food Insecurity: Not on file  Transportation Needs: Not on file  Physical Activity: Not on file  Stress: Not on file  Social Connections: Not on file   Family History  Problem Relation Age of Onset   Diabetes Mother    Congestive Heart Failure Mother    Diabetes Maternal Grandmother    Allergies  Allergen Reactions   Indomethacin     Disoriented   Prior to Admission medications   Medication Sig Start Date End Date Taking? Authorizing Provider  allopurinol (ZYLOPRIM) 300 MG tablet TAKE 1 TABLET(300 MG) BY MOUTH EVERY EVENING Patient taking differently: Take 300 mg by mouth  daily. 07/09/19  Yes Rakes, Connye Burkitt, FNP  aspirin (BAYER ASPIRIN) 325 MG tablet Take 1 tablet (325 mg total) by mouth daily. 12/21/21 01/20/22 Yes Martinique, Jesse J, PA-C  aspirin EC 81 MG tablet Take 81 mg by mouth at bedtime.   Yes [provider]  calcium carbonate (OS-CAL) 600 MG TABS tablet Take 600 mg by mouth 2 (two) times daily with a meal.   Yes [provider]  chlorthalidone (HYGROTON) 25 MG tablet Take 1 tablet (25 mg total) by mouth daily. (Needs to be seen before next refill) 08/22/19  Yes Rakes, Connye Burkitt, FNP  cholecalciferol (VITAMIN D3) 25 MCG (1000 UNIT) tablet Take 1,000 Units by mouth daily.   Yes  [provider]  desonide (DESOWEN) 0.05 % cream Apply 1 application topically daily as needed (irritation).   Yes [provider]  lisinopril (ZESTRIL) 5 MG tablet Take 5 mg by mouth daily. 04/22/20  Yes [provider]  Multiple Vitamins-Minerals (MULTI FOR HIM) TABS See admin instructions.   Yes [provider]  naproxen sodium (ALEVE) 220 MG tablet Take 440 mg by mouth 2 (two) times daily as needed (pain).   Yes [provider]  oxyCODONE (ROXICODONE) 5 MG immediate release tablet Take 1 tablet (5 mg total) by mouth every 4 (four) hours as needed for up to 5 days for severe pain. 12/21/21 12/26/21 Yes [place], Jesse J, PA-C  pantoprazole (PROTONIX) 40 MG tablet TAKE 1 TABLET BY MOUTH DAILY AFTER DINNER Patient taking differently: Take 40 mg by mouth daily. TAKE 1 TABLET BY MOUTH  DAILY AFTER DINNER 09/06/19  Yes Rakes, Connye Burkitt, FNP  simvastatin (ZOCOR) 20 MG tablet TAKE 1 TABLET BY MOUTH DAILY AT 6 PM(PLEASE MAKE 6 MONTHS CHECK-UP WITH NEXT 30 DAYS) Patient taking differently: Take 20 mg by mouth daily at 6 PM. 07/26/19  Yes Rakes, Connye Burkitt, FNP  acetaminophen (TYLENOL) 325 MG tablet Take 2 tablets (650 mg total) by mouth every 6 (six) hours as needed for mild pain. 02/20/18   Shepperson, Kirstin, PA-C  ondansetron (ZOFRAN) 4 MG tablet Take 1 tablet (4 mg total) by mouth every 8 (eight) hours as needed for nausea or vomiting. 07/14/21   Trula Slade, DPM     Positive ROS: All other systems have been reviewed and were otherwise negative with the exception of those mentioned in the HPI and as above.  Physical Exam:  Vitals:   12/21/21 0845 12/21/21 0850  BP: 140/62   Pulse: 73 75  Resp: 16 14  Temp:    SpO2: 100% 100%   General: Alert, no acute distress Cardiovascular: No pedal edema Respiratory: No cyanosis, no use of accessory musculature GI: No organomegaly, abdomen is soft and non-tender Skin: No lesions in the area of chief  complaint Neurologic: Sensation intact distally Psychiatric: Patient is competent for consent with normal mood and affect Lymphatic: No axillary or cervical lymphadenopathy  MUSCULOSKELETAL: Left ankle demonstrates some swelling.  Clinically is well aligned.  Pain with passive and active range of motion of the ankle.  Ankle is stable.  No tenderness to palpation about the forefoot or midfoot.  Tender to palpation with the ankle medially, laterally and anteriorly.  No pain with manipulation of the hindfoot.  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  We will plan for proximal tibial bone graft if the cysts are within view after the bone cuts.  Patient will  be admitted overnight for observation and likely discharge home tomorrow.  He will be nonweightbearing..  We discussed the risks, benefits and alternatives of surgery which include but are not limited to wound healing complications, infection, nonunion, malunion, need for further surgery, damage to surrounding structures and continued pain.  They understand there is no guarantees to an acceptable outcome.  After weighing these risks they opted to proceed with surgery.     Erle Crocker, MD    12/21/2021 8:54 AM

## 2021-12-21 NOTE — Evaluation (Signed)
Physical Therapy Evaluation Patient Details Name: [name] MRN: [id number] DOB: Nov 14, 1950 Today's Date: 12/21/2021  History of Present Illness  71 yo male with onset of L ankle endstage OA was admitted on 2/21 with surgery for total ankle arthroplasty.  Pt was referred to PT imminent DC, permitted to move minimally.  PMHx:  CKD, gout, HTN, non hodgkins lymphoma, tinnitus, scoliosis, general OA  Clinical Impression  Pt was seen for initiation of gentle mobility and told PT that he decided after surgery to stay another day.  He is going to need to climb 4 steps to enter the house, and talked with him about crutches with the railing on the stairs as a good way to manage the NWB.  Pt is interested in that idea, and will work on the stairs tomorrow when he is more rested from surgery.  Pt is accompanied by wife to hear all instructions.  Pt was taken off O2 by nursing and was not lower than 93% saturation with all movement.  Follow along with pt for goals of PT as outlined below.       Recommendations for follow up therapy are one component of a multi-disciplinary discharge planning process, led by the attending physician.  Recommendations may be updated based on patient status, additional functional criteria and insurance authorization.  Follow Up Recommendations Home health PT    Assistance Recommended at Discharge Intermittent Supervision/Assistance  Patient can return home with the following  A little help with walking and/or transfers;A little help with bathing/dressing/bathroom;Help with stairs or ramp for entrance;Assist for transportation    Equipment Recommendations None recommended by PT  Recommendations for Other Services       Functional Status Assessment Patient has had a recent decline in their functional status and demonstrates the ability to make significant improvements in function in a reasonable and predictable amount of time.     Precautions / Restrictions  Precautions Precautions: Fall Required Braces or Orthoses: Splint/Cast (L ankle post op) Restrictions Weight Bearing Restrictions: Yes LLE Weight Bearing: Non weight bearing      Mobility  Bed Mobility Overal bed mobility: Needs Assistance Bed Mobility: Supine to Sit, Sit to Supine     Supine to sit: Min guard Sit to supine: Min guard   General bed mobility comments: min guard with pt using bed rail    Transfers Overall transfer level: Needs assistance Equipment used: Rolling walker (2 wheels), 1 person hand held assist Transfers: Sit to/from Stand Sit to Stand: Min assist, Min guard           General transfer comment: min assist to stand and min guard to steady with gait belt    Ambulation/Gait Ambulation/Gait assistance: Min guard Gait Distance (Feet): 20 Feet Assistive device: Rolling walker (2 wheels), 1 person hand held assist   Gait velocity: reduced Gait velocity interpretation: <1.31 ft/sec, indicative of household ambulator Pre-gait activities: standing balance and cues about LLE NWB General Gait Details: pt was slowly hopping on RLE with avoidance of WB on LLE.  Pt had decided not to go home today and so will go tomorrow.  Stairs            Wheelchair Mobility    Modified Rankin (Stroke Patients Only)       Balance Overall balance assessment: Needs assistance Sitting-balance support: Single extremity supported Sitting balance-Leahy Scale: Good     Standing balance support: Bilateral upper extremity supported, During functional activity Standing balance-Leahy Scale: Poor Standing balance comment: requires UE  support bilaterally                             Pertinent Vitals/Pain      Home Living Family/patient expects to be discharged to:: Private residence Living Arrangements: Spouse/significant other Available Help at Discharge: Family;Available 24 hours/day Type of Home: House Home Access: Stairs to enter Entrance  Stairs-Rails: Right Entrance Stairs-Number of Steps: 4 Alternate Level Stairs-Number of Steps: flight Home Layout: Two level;Able to live on main level with bedroom/bathroom Home Equipment: Rolling Walker (2 wheels);Cane - single point;Shower seat;Crutches;BSC/3in1 Additional Comments: pt has been seen in 2019 for surgery on R TKA, has many pieces of equipment.  Will not go up to second floor to full bath upon dc home    Prior Function Prior Level of Function : Independent/Modified Independent             Mobility Comments: SPC previous to surgery ADLs Comments: I for all self care     Hand Dominance   Dominant Hand: Right    Extremity/Trunk Assessment   Upper Extremity Assessment Upper Extremity Assessment: Overall WFL for tasks assessed    Lower Extremity Assessment Lower Extremity Assessment: LLE deficits/detail LLE Deficits / Details: splint on LLE post op LLE Coordination: decreased gross motor    Cervical / Trunk Assessment Cervical / Trunk Assessment: Other exceptions (has scoliosis)  Communication   Communication: No difficulties  Cognition Arousal/Alertness: Awake/alert Behavior During Therapy: WFL for tasks assessed/performed Overall Cognitive Status: Within Functional Limits for tasks assessed                                 General Comments: did have a bit of distraction for questions from PT        General Comments General comments (skin integrity, edema, etc.): Pt is up to side of bed and did short walk to BR, able to adequately stand on RLE with help, aware of the limits of permission and adhering to wb precautions.  Has 4 steps to accomplish tomorrow    Exercises     Assessment/Plan    PT Assessment Patient needs continued PT services  PT Problem List Decreased activity tolerance;Decreased balance;Decreased mobility;Decreased skin integrity;Pain       PT Treatment Interventions DME instruction;Gait training;Stair  training;Functional mobility training;Therapeutic activities;Therapeutic exercise;Balance training;Neuromuscular re-education;Patient/family education    PT Goals (Current goals can be found in the Care Plan section)  Acute Rehab PT Goals Patient Stated Goal: to go home and get better PT Goal Formulation: With patient/family Time For Goal Achievement: 12/28/21 Potential to Achieve Goals: Good    Frequency Min 5X/week     Co-evaluation               AM-PAC PT "6 Clicks" Mobility  Outcome Measure Help needed turning from your back to your side while in a flat bed without using bedrails?: None Help needed moving from lying on your back to sitting on the side of a flat bed without using bedrails?: A Little Help needed moving to and from a bed to a chair (including a wheelchair)?: A Little Help needed standing up from a chair using your arms (e.g., wheelchair or bedside chair)?: A Little Help needed to walk in hospital room?: A Little Help needed climbing 3-5 steps with a railing? : Total 6 Click Score: 17    End of Session Equipment Utilized During Treatment:  Gait belt;Oxygen Activity Tolerance: Patient tolerated treatment well;Treatment limited secondary to medical complications (Comment) Patient left: in bed;with call bell/phone within reach;with bed alarm set;with family/visitor present;Other (comment) (elevation on RLE) Nurse Communication: Mobility status;Other (comment) (O2 sats remained at no lower than 93% on room air) PT Visit Diagnosis: Unsteadiness on feet (R26.81);Pain Pain - Right/Left: Left Pain - part of body: Ankle and joints of foot    Time: [id number] [phone] questions) PT Time Calculation (min) (ACUTE ONLY): 28 min   Charges:   PT Evaluation $PT Eval Moderate Complexity: 1 Mod PT Treatments $Gait Training: 8-22 mins $Therapeutic Activity: 8-22 mins       Ramond Dial 12/21/2021, 5:12 PM  Mee Hives, PT PhD Acute Rehab Dept. Number: [place] [facility] [place]

## 2021-12-22 ENCOUNTER — Encounter (HOSPITAL_COMMUNITY): Payer: Self-pay | Admitting: Orthopaedic Surgery

## 2021-12-22 DIAGNOSIS — N182 Chronic kidney disease, stage 2 (mild): Secondary | ICD-10-CM | POA: Diagnosis not present

## 2021-12-22 DIAGNOSIS — M19072 Primary osteoarthritis, left ankle and foot: Secondary | ICD-10-CM | POA: Diagnosis not present

## 2021-12-22 DIAGNOSIS — K219 Gastro-esophageal reflux disease without esophagitis: Secondary | ICD-10-CM | POA: Diagnosis not present

## 2021-12-22 DIAGNOSIS — M25772 Osteophyte, left ankle: Secondary | ICD-10-CM | POA: Diagnosis not present

## 2021-12-22 DIAGNOSIS — I129 Hypertensive chronic kidney disease with stage 1 through stage 4 chronic kidney disease, or unspecified chronic kidney disease: Secondary | ICD-10-CM | POA: Diagnosis not present

## 2021-12-22 NOTE — Plan of Care (Signed)

## 2021-12-22 NOTE — Progress Notes (Signed)
Physical Therapy Treatment Patient Details Name: [name] MRN: [id number] DOB: 1951/05/30 Today's Date: 12/22/2021   History of Present Illness 71 yo male with onset of L ankle endstage OA was admitted on 2/21 with surgery for total ankle arthroplasty.  Pt was referred to PT imminent DC, permitted to move minimally. PMHx: CKD, gout, HTN, non hodgkins lymphoma, tinnitus, scoliosis, general OA    PT Comments    Pt received in chair, eager to progress mobility and perform gait/stair training. Pt with good effort for transfers/gait, needing up to min guard for sit<>stand and gait with crutches and only Supervision with RW transfers and gait  After demo, pt able to perform stair trial with crutches and then with RW, needing min/modA for stair ascent/descent with crutches and min guard to minA with RW, spouse able to demo back guarding positions to assist pt with these tasks. Discussed activity pacing/energy conservation, car transfers, pt asking about fall prevention for bathroom transfers (also discussed with OT prior to session), and AD modifications for pt comfort with proper height/fit and ease of mobility over rugs. Pt continues to benefit from PT services to progress toward functional mobility goals. Anticipate pt safe to DC home with spouse assist for all mobility once medically cleared, pt reports he may continue with OPPT once cleared for weight bearing, will defer to surgeon recommendation.  Recommendations for follow up therapy are one component of a multi-disciplinary discharge planning process, led by the attending physician.  Recommendations may be updated based on patient status, additional functional criteria and insurance authorization.  Follow Up Recommendations  Home health PT (per pt he may decide on OPPT once weight bearing allowed)     Assistance Recommended at Discharge Intermittent Supervision/Assistance  Patient can return home with the following A little help with  walking and/or transfers;A little help with bathing/dressing/bathroom;Help with stairs or ramp for entrance;Assist for transportation   Equipment Recommendations  None recommended by PT (pt reports he owns RW and crutches)    Recommendations for Other Services       Precautions / Restrictions Precautions Precautions: Fall Precaution Comments: L foot numbness Required Braces or Orthoses: Splint/Cast Restrictions Weight Bearing Restrictions: Yes LLE Weight Bearing: Non weight bearing     Mobility  Bed Mobility Overal bed mobility: Modified Independent             General bed mobility comments: pt received/remained in recliner, reports no difficulty    Transfers Overall transfer level: Needs assistance Equipment used: Rolling walker (2 wheels) Transfers: Sit to/from Stand Sit to Stand: Supervision, Min guard           General transfer comment: from recliner<>crutches needing min guard for stability with minor LOB; from recliner<>RW, Supervision and no LOB    Ambulation/Gait Ambulation/Gait assistance: Min guard, Supervision Gait Distance (Feet): 125 Feet (+14ft with crutches) Assistive device: Rolling walker (2 wheels), 1 person hand held assist Gait Pattern/deviations:  (hop-to)       General Gait Details: good use of RW, min cues for body mechanics with regard to RW height and how to adjust at home if needed (personal RW not in room during session); with crutches ~21ft, pt with minor LOB initially needing min guard for safety   Stairs Stairs: Yes Stairs assistance: Min assist, Mod assist Stair Management: With walker, With crutches, Backwards, Step to pattern Number of Stairs: 6 (2 with crutches, then 4 with RW) General stair comments: pt ascended/descended 2 steps with crutches however somewhat unstable and needing  min to modA for stability with LOB, visual demo given prior to each attempt. Pt then performed 4 steps with RW (2+2 steps in PT gym) and no LOB,  spouse present assisting with guarding positions; gait belt for safety   Wheelchair Mobility    Modified Rankin (Stroke Patients Only)       Balance Overall balance assessment: Needs assistance Sitting-balance support: Single extremity supported Sitting balance-Leahy Scale: Good     Standing balance support: Bilateral upper extremity supported, During functional activity Standing balance-Leahy Scale: Poor Standing balance comment: requires UE support bilaterally, pt with difficulty with SLS on RLE causing LOB while getting crutches ready for gait                            Cognition Arousal/Alertness: Awake/alert Behavior During Therapy: WFL for tasks assessed/performed Overall Cognitive Status: Within Functional Limits for tasks assessed                                 General Comments: at times tangential, pleasantly cooperative        Exercises      General Comments General comments (skin integrity, edema, etc.): spouse receptive to instruction on safety/guarding with stairs and gait belt; VSS per chart review and no overt s/sx distress throughout, pt denies significant pain; discussed car transfer/safety      Pertinent Vitals/Pain Pain Assessment Pain Assessment: No/denies pain (L foot "numbness"/decreased sensation)    Home Living Family/patient expects to be discharged to:: Private residence Living Arrangements: Spouse/significant other Available Help at Discharge: Family;Available 24 hours/day Type of Home: House Home Access: Stairs to enter Entrance Stairs-Rails: Right Entrance Stairs-Number of Steps: 4 Alternate Level Stairs-Number of Steps: flight Home Layout: Two level;Able to live on main level with bedroom/bathroom Home Equipment: Rolling Walker (2 wheels);Cane - single point;Shower seat;Crutches;BSC/3in1 Additional Comments: pt has been seen in 2019 for surgery on R TKA, has many pieces of equipment.  Will not go up to second  floor to full bath upon dc home    Prior Function            PT Goals (current goals can now be found in the care plan section) Acute Rehab PT Goals Patient Stated Goal: to go home and get better PT Goal Formulation: With patient/family Time For Goal Achievement: 12/28/21 Progress towards PT goals: Progressing toward goals    Frequency    Min 5X/week      PT Plan Current plan remains appropriate       AM-PAC PT "6 Clicks" Mobility   Outcome Measure  Help needed turning from your back to your side while in a flat bed without using bedrails?: None Help needed moving from lying on your back to sitting on the side of a flat bed without using bedrails?: None Help needed moving to and from a bed to a chair (including a wheelchair)?: A Little Help needed standing up from a chair using your arms (e.g., wheelchair or bedside chair)?: A Little Help needed to walk in hospital room?: A Little Help needed climbing 3-5 steps with a railing? : A Lot (mod safety cues; safer with RW) 6 Click Score: 19    End of Session Equipment Utilized During Treatment: Gait belt Activity Tolerance: Patient tolerated treatment well Patient left: in chair;with call bell/phone within reach;with chair alarm set;with nursing/sitter in room;with family/visitor present Nurse Communication: Mobility status  PT Visit Diagnosis: Unsteadiness on feet (R26.81);Pain     Time: 8307-4600 PT Time Calculation (min) (ACUTE ONLY): 31 min  Charges:  $Gait Training: 8-22 mins $Therapeutic Activity: 8-22 mins                     Eliyas Suddreth P., PTA Acute Rehabilitation Services Pager: [phone] Office: [place] 12/22/2021, 11:06 AM

## 2021-12-22 NOTE — Progress Notes (Signed)
Verbal approval by Mark Martinique PA for Dr. Lucia Gaskins to give the Lisinopril to patient with 95/65, due to this being the patient's normal range and this is a home medication. Also to cancel the home health PT and there is no need for outpatient therapy at this time due to need for strict rest of the ankle. Informed patient of this conversation.

## 2021-12-22 NOTE — Evaluation (Signed)
Occupational Therapy Evaluation Patient Details Name: [name] MRN: [id number] DOB: 07-18-1951 Today's Date: 12/22/2021   History of Present Illness 71 yo male with onset of L ankle endstage OA was admitted on 2/21 with surgery for total ankle arthroplasty.  Pt was referred to PT imminent DC, permitted to move minimally.  PMHx:  CKD, gout, HTN, non hodgkins lymphoma, tinnitus, scoliosis, general OA   Clinical Impression   Pt independent at baseline with ADLs, uses cane for functional mobility. Lives with wife who is available to assist at d/c. Pt currently supervision-min A for ADLs, mod I for bed mobility and supervision-min guard for transfers. Pt reports having all DME at home from previous TKA, able to complete toileting/transfer during session and demonstrates figure 4 for LB dressing. Pt adheres well to NWB precautions throughout session with RW. Pt presenting with impairments listed below, will follow actuely. Recommend d/c home with assistance.     Recommendations for follow up therapy are one component of a multi-disciplinary discharge planning process, led by the attending physician.  Recommendations may be updated based on patient status, additional functional criteria and insurance authorization.   Follow Up Recommendations  No OT follow up    Assistance Recommended at Discharge Set up Supervision/Assistance  Patient can return home with the following A little help with walking and/or transfers;A little help with bathing/dressing/bathroom;Help with stairs or ramp for entrance;Assist for transportation    Functional Status Assessment  Patient has had a recent decline in their functional status and demonstrates the ability to make significant improvements in function in a reasonable and predictable amount of time.  Equipment Recommendations  None recommended by OT;Other (comment) (pt has all needed DME from prior TKA)    Recommendations for Other Services        Precautions / Restrictions Precautions Precautions: Fall Required Braces or Orthoses: Splint/Cast Restrictions Weight Bearing Restrictions: Yes LLE Weight Bearing: Non weight bearing      Mobility Bed Mobility Overal bed mobility: Modified Independent             General bed mobility comments: sits EOB without use of rails, negotiates LLE well to EOB    Transfers Overall transfer level: Needs assistance Equipment used: Rolling walker (2 wheels) Transfers: Sit to/from Stand Sit to Stand: Supervision           General transfer comment: supervision to stand and ambulate in room/to bathroom      Balance Overall balance assessment: Needs assistance Sitting-balance support: Single extremity supported Sitting balance-Leahy Scale: Good     Standing balance support: Bilateral upper extremity supported, During functional activity Standing balance-Leahy Scale: Poor Standing balance comment: requires UE support bilaterally                           ADL either performed or assessed with clinical judgement   ADL Overall ADL's : Needs assistance/impaired Eating/Feeding: Set up;Sitting   Grooming: Set up;Standing;Sitting   Upper Body Bathing: Supervision/ safety;Sitting   Lower Body Bathing: Minimal assistance;Moderate assistance;Sit to/from stand   Upper Body Dressing : Supervision/safety;Sitting   Lower Body Dressing: Minimal assistance;Moderate assistance Lower Body Dressing Details (indicate cue type and reason): simulates figure 4 to don socks Toilet Transfer: Min guard;Rolling walker (2 wheels);Ambulation;Regular Toilet   Toileting- Clothing Manipulation and Hygiene: Sitting/lateral lean;Modified independent Toileting - Clothing Manipulation Details (indicate cue type and reason): completes pericare and clothing mgmt     Functional mobility during ADLs: Min guard;Rolling walker (  2 wheels)       Vision   Vision Assessment?: No apparent visual  deficits     Perception     Praxis      Pertinent Vitals/Pain Pain Assessment Pain Assessment: No/denies pain     Hand Dominance Right   Extremity/Trunk Assessment Upper Extremity Assessment Upper Extremity Assessment: Overall WFL for tasks assessed   Lower Extremity Assessment Lower Extremity Assessment: Defer to PT evaluation   Cervical / Trunk Assessment Cervical / Trunk Assessment: Normal   Communication Communication Communication: No difficulties   Cognition Arousal/Alertness: Awake/alert Behavior During Therapy: WFL for tasks assessed/performed Overall Cognitive Status: Within Functional Limits for tasks assessed                                       General Comments  spouse in room during session    Exercises     Shoulder Instructions      Home Living Family/patient expects to be discharged to:: Private residence Living Arrangements: Spouse/significant other Available Help at Discharge: Family;Available 24 hours/day Type of Home: House Home Access: Stairs to enter [employer] of Steps: 4 Entrance Stairs-Rails: Right Home Layout: Two level;Able to live on main level with bedroom/bathroom Alternate Level Stairs-Number of Steps: flight   Bathroom Shower/Tub: Occupational psychologist: Standard     Home Equipment: Conservation officer, nature (2 wheels);Cane - single point;Shower seat;Crutches;BSC/3in1   Additional Comments: pt has been seen in 2019 for surgery on R TKA, has many pieces of equipment.  Will not go up to second floor to full bath upon dc home      Prior Functioning/Environment Prior Level of Function : Independent/Modified Independent             Mobility Comments: SPC previous to surgery ADLs Comments: does ADLs/IADLs independently        OT Problem List: Decreased strength;Decreased range of motion;Decreased activity tolerance;Impaired balance (sitting and/or standing);Pain;Impaired UE functional  use;Decreased knowledge of use of DME or AE;Decreased knowledge of precautions;Decreased safety awareness      OT Treatment/Interventions: Self-care/ADL training;Therapeutic exercise;Neuromuscular education;Energy conservation;Therapeutic activities;Patient/family education;Balance training    OT Goals(Current goals can be found in the care plan section) Acute Rehab OT Goals Patient Stated Goal: to go home OT Goal Formulation: With patient Time For Goal Achievement: 01/05/22 Potential to Achieve Goals: Good ADL Goals Pt Will Perform Upper Body Dressing: Independently;sitting Pt Will Perform Lower Body Dressing: with supervision;sit to/from stand;sitting/lateral leans Pt Will Transfer to Toilet: Independently;ambulating;regular height toilet Pt Will Perform Tub/Shower Transfer: Shower transfer;shower seat;ambulating;rolling walker;with supervision  OT Frequency: Min 2X/week    Co-evaluation              AM-PAC OT "6 Clicks" Daily Activity     Outcome Measure Help from another person eating meals?: None Help from another person taking care of personal grooming?: None Help from another person toileting, which includes using toliet, bedpan, or urinal?: None Help from another person bathing (including washing, rinsing, drying)?: A Little Help from another person to put on and taking off regular upper body clothing?: None Help from another person to put on and taking off regular lower body clothing?: A Lot 6 Click Score: 21   End of Session Equipment Utilized During Treatment: Gait belt;Rolling walker (2 wheels) Nurse Communication: Mobility status  Activity Tolerance: Patient tolerated treatment well Patient left: in chair;with call bell/phone within reach;with nursing/sitter in  room  OT Visit Diagnosis: Unsteadiness on feet (R26.81);Other abnormalities of gait and mobility (R26.89);Repeated falls (R29.6);Muscle weakness (generalized) (M62.81)                Time: 6629-4765 OT  Time Calculation (min): 20 min Charges:  OT General Charges $OT Visit: 1 Visit OT Evaluation $OT Eval Low Complexity: 1 Low  Lynnda Child, OTD, OTR/L Acute Rehab [phone] - [place] 12/22/2021, 9:15 AM

## 2021-12-22 NOTE — Progress Notes (Signed)
° ° ° °  [name] is a 71 y.o. male   Orthopaedic diagnosis: Left ankle end-stage arthritis, status post left total ankle replacement 12/21/2021  Subjective: Patient appears comfortable in bed.  He has no significant pain.  Persist with numbness in the left lower extremity, she correlates to a nerve block. Has not been up with PT or OT today but looking forward to it.  Looking forward to being discharged home.  Family at bedside.  No fever, chills, or night sweats.  No nausea or vomiting.  Objectyive: Vitals:   12/22/21 0456 12/22/21 0733  BP: 96/62 95/62  Pulse: 75 65  Resp: 17   Temp: 97.8 F (36.6 C) 98.5 F (36.9 C)  SpO2: 97% 94%     Exam: Awake and alert Respirations even and unlabored No acute distress  Examination of the left ankle demonstrates a well fitted, clean, dry, and intact lower extremity splint.  Splint was not removed.  Altered sensation about the toes and is unable to wiggle them secondary to peripheral nerve block perioperatively yesterday.  Swelling appropriate about the toes.  The toes are warm and well-perfused.  The motion at the knee is intact.  Dressing proximal to the splint intact.  Assessment: Postop day 1 status post left total ankle replacement, doing well and suitable for discharge home   Plan: -Nonweightbearing left lower extremity with use of crutches or walker.  He may come by the office today to pick up a DME prescription for a rolling knee scooter. -PT/OT evaluation today. -Oxycodone outpatient for pain control -Discontinue 81 mg aspirin, start 325 aspirin x1 month for DVT prophylaxis, may resume 81 mg aspirin thereafter -Follow-up outpatient with Dr. Lucia Gaskins in 2 weeks for radiographs and suture removal if appropriate.  Call the office with concerns in the interim.   Orella Cushman J. Martinique, PA-C

## 2021-12-23 NOTE — Discharge Summary (Signed)
Patient ID: [name] MRN: [id number] DOB/AGE: [birth year] 71 y.o.  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 male who was admitted 12/21/2021 for operative treatment of Ankle arthritis. Patient has severe unremitting pain that affects sleep, daily activities, and work/hobbies. After pre-op clearance the patient was taken to the operating room on 12/21/2021 and underwent  Procedure(s): LEFT TOTAL ANKLE ARTHROPLASTY, PROXIMAL TIBIAL BONE GRAFT HARVEST.    Patient was given perioperative antibiotics:  Anti-infectives (From admission, onward)    Start     Dose/Rate Route Frequency Ordered Stop   12/21/21 1400  ceFAZolin (ANCEF) IVPB 1 g/50 mL premix        1 g 100 mL/hr over 30 Minutes Intravenous Every 6 hours 12/21/21 1308 12/22/21 0230   12/21/21 1004  vancomycin (VANCOCIN) powder  Status:  Discontinued          As needed 12/21/21 1005 12/21/21 1224   12/21/21 0730  ceFAZolin (ANCEF) IVPB 2g/100 mL premix        2 g 200 mL/hr over 30 Minutes Intravenous On call to O.R. 12/21/21 0725 12/21/21 3474        Patient was given sequential compression devices, early ambulation, and chemoprophylaxis to prevent DVT.  Patient benefited  maximally from hospital stay and there were no complications.    Recent vital signs: No data found.   Recent laboratory studies:  Recent Labs    12/21/21 1320  WBC 9.3  HGB 14.2  HCT 39.6  PLT 241  CREATININE 1.26*     Discharge Medications:   Allergies as of 12/22/2021       Reactions   Indomethacin    Disoriented        Medication List     STOP taking these medications    aspirin EC 81 MG tablet Replaced by: aspirin 325 MG tablet       TAKE these medications    acetaminophen 325 MG tablet Commonly known as: Tylenol Take 2 tablets (650 mg total) by mouth every 6 (six) hours as needed for mild pain.   allopurinol 300 MG tablet Commonly known as: ZYLOPRIM TAKE 1 TABLET(300 MG) BY MOUTH EVERY EVENING What changed: See the new instructions.   aspirin 325 MG tablet Commonly known as: Bayer Aspirin Take 1 tablet (325 mg total) by mouth daily. Replaces: aspirin EC 81 MG tablet   calcium carbonate 600 MG Tabs tablet Commonly known as: OS-CAL Take 600 mg by mouth 2 (two) times daily with a meal.   chlorthalidone 25 MG tablet Commonly known as: HYGROTON Take 1 tablet (25 mg total) by mouth daily. (Needs to be seen before next refill)   cholecalciferol 25 MCG (1000 UNIT) tablet Commonly known  as: VITAMIN D3 Take 1,000 Units by mouth daily.   desonide 0.05 % cream Commonly known as: DESOWEN Apply 1 application topically daily as needed (irritation).   lisinopril 5 MG tablet Commonly known as: ZESTRIL Take 5 mg by mouth daily.   Multi For Him Tabs See admin instructions.   naproxen sodium 220 MG tablet Commonly known as: ALEVE Take 440 mg by mouth 2 (two) times daily as needed (pain).   ondansetron 4 MG tablet Commonly known as: Zofran Take 1 tablet (4 mg total) by mouth every 8 (eight) hours as needed for nausea or vomiting.   oxyCODONE 5 MG immediate release tablet Commonly known as: Roxicodone Take 1 tablet (5 mg total) by mouth every 4  (four) hours as needed for up to 5 days for severe pain.   pantoprazole 40 MG tablet Commonly known as: PROTONIX TAKE 1 TABLET BY MOUTH DAILY AFTER DINNER What changed: See the new instructions.   simvastatin 20 MG tablet Commonly known as: ZOCOR TAKE 1 TABLET BY MOUTH DAILY AT 6 PM(PLEASE MAKE 6 MONTHS CHECK-UP WITH NEXT 30 DAYS) What changed: See the new instructions.               Discharge Care Instructions  (From admission, onward)           Start     Ordered   12/22/21 0000  Non weight bearing        12/22/21 0835   12/21/21 0000  Non weight bearing        12/21/21 0847            Diagnostic Studies: DG Ankle Complete Left  Result Date: 12/21/2021 CLINICAL DATA:  Status post left ankle arthroplasty EXAM: LEFT ANKLE COMPLETE - 3+ VIEW COMPARISON:  03/04/2021 FINDINGS: Fluoroscopic images show interval left ankle arthroplasty. No fracture is seen. Fluoroscopic time was 101 seconds. Estimated radiation dose is 2.44 mGy. IMPRESSION: Fluoroscopic assistance was provided for left ankle arthroplasty. Electronically Signed   By: Elmer Picker M.D.   On: 12/21/2021 12:56   DG C-Arm 1-60 Min-No Report  Result Date: 12/21/2021 Fluoroscopy was utilized by the requesting physician.  No radiographic interpretation.   DG C-Arm 1-60 Min-No Report  Result Date: 12/21/2021 Fluoroscopy was utilized by the requesting physician.  No radiographic interpretation.   DG MINI C-ARM IMAGE ONLY  Result Date: 12/21/2021 There is no interpretation for this exam.  This order is for images obtained during a surgical procedure.  Please See "Surgeries" Tab for more information regarding the procedure.    Disposition: Discharge disposition: 01-Home or Self Care       Discharge Instructions     Call MD / Call 911   Complete by: As directed    If you experience chest pain or shortness of breath, CALL 911 and be transported to the hospital emergency room.  If you develope a  fever above 101 F, pus (white drainage) or increased drainage or redness at the wound, or calf pain, call your surgeon's office.   Call MD / Call 911   Complete by: As directed    If you experience chest pain or shortness of breath, CALL 911 and be transported to the hospital emergency room.  If you develope a fever above 101 F, pus (white drainage) or increased drainage or redness at the wound, or calf pain, call your surgeon's office.   Constipation Prevention   Complete by: As directed    Drink plenty of fluids.  Prune juice  may be helpful.  You may use a stool softener, such as Colace (over the counter) 100 mg twice a day.  Use MiraLax (over the counter) for constipation as needed.   Constipation Prevention   Complete by: As directed    Drink plenty of fluids.  Prune juice may be helpful.  You may use a stool softener, such as Colace (over the counter) 100 mg twice a day.  Use MiraLax (over the counter) for constipation as needed.   Diet - low sodium heart healthy   Complete by: As directed    Non weight bearing   Complete by: As directed    Non weight bearing   Complete by: As directed    Post-operative opioid taper instructions:   Complete by: As directed    POST-OPERATIVE OPIOID TAPER INSTRUCTIONS: It is important to wean off of your opioid medication as soon as possible. If you do not need pain medication after your surgery it is ok to stop day one. Opioids include: Codeine, Hydrocodone(Norco, Vicodin), Oxycodone(Percocet, oxycontin) and hydromorphone amongst others.  Long term and even short term use of opiods can cause: Increased pain response Dependence Constipation Depression Respiratory depression And more.  Withdrawal symptoms can include Flu like symptoms Nausea, vomiting And more Techniques to manage these symptoms Hydrate well Eat regular healthy meals Stay active Use relaxation techniques(deep breathing, meditating, yoga) Do Not substitute Alcohol to help with  tapering If you have been on opioids for less than two weeks and do not have pain than it is ok to stop all together.  Plan to wean off of opioids This plan should start within one week post op of your joint replacement. Maintain the same interval or time between taking each dose and first decrease the dose.  Cut the total daily intake of opioids by one tablet each day Next start to increase the time between doses. The last dose that should be eliminated is the evening dose.      Post-operative opioid taper instructions:   Complete by: As directed    POST-OPERATIVE OPIOID TAPER INSTRUCTIONS: It is important to wean off of your opioid medication as soon as possible. If you do not need pain medication after your surgery it is ok to stop day one. Opioids include: Codeine, Hydrocodone(Norco, Vicodin), Oxycodone(Percocet, oxycontin) and hydromorphone amongst others.  Long term and even short term use of opiods can cause: Increased pain response Dependence Constipation Depression Respiratory depression And more.  Withdrawal symptoms can include Flu like symptoms Nausea, vomiting And more Techniques to manage these symptoms Hydrate well Eat regular healthy meals Stay active Use relaxation techniques(deep breathing, meditating, yoga) Do Not substitute Alcohol to help with tapering If you have been on opioids for less than two weeks and do not have pain than it is ok to stop all together.  Plan to wean off of opioids This plan should start within one week post op of your joint replacement. Maintain the same interval or time between taking each dose and first decrease the dose.  Cut the total daily intake of opioids by one tablet each day Next start to increase the time between doses. The last dose that should be eliminated is the evening dose.           Follow-up Information     Erle Crocker, MD Follow up in 2 week(s).   Specialty: Orthopedic Surgery Contact  information: [address] [place] [address] [phone]  Signed: Cooper Moroney J [place] 12/23/2021, 8:38 AM

## 2021-12-24 ENCOUNTER — Encounter (HOSPITAL_COMMUNITY): Payer: Self-pay | Admitting: Orthopaedic Surgery

## 2022-01-05 DIAGNOSIS — Z9889 Other specified postprocedural states: Secondary | ICD-10-CM | POA: Diagnosis not present

## 2022-01-05 DIAGNOSIS — M19072 Primary osteoarthritis, left ankle and foot: Secondary | ICD-10-CM | POA: Diagnosis not present

## 2022-01-10 NOTE — Op Note (Signed)
[place] male 71 y.o. 12/21/2021  PreOperative Diagnosis: Left tibiotalar joint arthritis  PostOperative Diagnosis: Left tibiotalar joint arthritis  PROCEDURE: Left total ankle arthroplasty  SURGEON: Melony Overly, MD  ASSISTANT: Jesse [place], PA-C: His assistance was necessary for prep and drape, exposure, holding retractors, prepping and placement of arthroplasty components, wound closure and splinting.   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  1x1 cm bone cyst within the tibia laterally.  IMPLANTS: Advantage total ankle arthroplasty with size 3 tibial plate size 4 talar plate 8 mm polyethylene component  INDICATIONS:71 y.o. malehad severe tibiotalar joint arthritis with relatively symmetric wear pattern.  There is CT evidence of small cyst within the lateral talus and kissing lesion within the lateral tibial plafond.  Plan was for bone grafting of these if necessary and total ankle arthroplasty.  Patient had failed conservative treatment the form of boot immobilization, anti-inflammatories, activity modifications and physical therapy.  He also had improved with injections previously.   Patient understood the risks, benefits and alternatives to surgery which include but are not limited to wound healing complications, infection, nonunion, malunion, need for further surgery as well as damage to surrounding structures. They also understood the potential for continued pain in that there were no guarantees of acceptable outcome After weighing these risks the patient opted to proceed with surgery.  PROCEDURE: Patient was identified in the preoperative holding area.  The left leg was marked by myself.  Consent was signed by myself and the patient.  Block was performed by anesthesia in the preoperative holding area.  Patient was taken to the operative suite and  placed supine on the operative table.  General anesthesia was induced without difficulty. Bump was placed under the operative hip and bone foam was used.  All bony prominences were well padded.  Tourniquet was placed on the operative thigh.  Preoperative antibiotics were given. The extremity was prepped and draped in the usual sterile fashion and surgical timeout was performed.  The limb was elevated and the tourniquet was inflated to 250 mmHg.  We began by making a longitudinal incision overlying the ankle joint.  This was midline about the ankle joint centered between the tibialis anterior and extensor hallucis longus tendons.  This was taken down through skin and subcu tissue.  The extensor retinacular tissue was incised in line with the incision and the tendons were retracted.  The neurovascular bundle was identified and protected through the entire the case.  In the subperiosteal and joint capsular tissue incision and subperiosteal dissection was carried out to the ankle joint.  Once access to the ankle joint was identified joint was mobilized.  It was fully exposed down the talar neck.  Osteophytes were removed with a round [place] osteotome.  Then the joint was inspected.  There is severe osteoarthritis of the ankle joint was full thickness cartilage wear of the tibia and talar component with some osteophytosis.  Once full exposure the ankle joint was performed the pin was placed with the tibial tubercle in the appropriate position in line with the second ray and with the ankle dorsiflexed.  Then the guide was placed down the length of the tibia and placed in the acceptable position distally.  Single pin was placed.  Then using fluoroscopy the appropriate rotation using the blade within the medial gutter as well as dorsiflexion rotation and varus valgus positioning was confirmed on fluoroscopy.  The guide was held provisionally with K wire fixation throughout.  The and the tibial bone cut was created using  a sagittal saw and a reciprocating saw.  The bone was removed.  Care was taken not to violate posterior joint capsular tissue.  All the bone was removed and cleaned out.  Then the talar component guide was placed in the appropriate position was confirmed.  It was pinned in place.  In the talar bone cuts were created.  Then using a rasp the talar dome was fully prepared.  Then we turned our attention to the bone cyst.  We are able to carry out the bone cyst within the tibia and talar component.  The talar area was very very small and a small amount of cancellus bone graft that was obtained from the tibial bone cut was placed within this.  Then the tibial bone cyst was also packed with bone graft obtained from the tibial cut.  Adequate amount of bone graft was harvested.  Then sizers were used to choose the correct component size.  A size 3 tibia component was chosen a size 4 talar component was chosen.  These were then placed without difficulty.  Then the polyethylene trial spacer was placed and ultimately we chose a size 8 polyethylene and this was placed as well.  The stay clip was placed without difficulty.  Last we confirmed appropriate positioning of the components and overall stability of the ankle was checked.  The wounds were then irrigated copiously with normal saline.  The wounds were closed in a layered fashion using 2-0 Vicryl, 3-0 Monocryl and 3-0 nylon suture.  He was placed into a short leg splint.  He was awakened from anesthesia and taken to recovery in stable condition.  There were no complications.  Counts were correct.  POST OPERATIVE INSTRUCTIONS: Nonweightbearing operative extremity Follow-up in 2 weeks for splint removal, nonweightbearing x-rays and suture removal if appropriate.  He will be placed into a walking boot at that time and allowed to progress weightbearing.  TOURNIQUET TIME:Less than 2 hours  BLOOD LOSS:  less than 50 mL         DRAINS: none         SPECIMEN:  none       COMPLICATIONS:  * No complications entered in OR log *         Disposition: PACU - hemodynamically stable.         Condition: stable

## 2022-01-21 DIAGNOSIS — Z20822 Contact with and (suspected) exposure to covid-19: Secondary | ICD-10-CM | POA: Diagnosis not present

## 2022-02-11 DIAGNOSIS — M19072 Primary osteoarthritis, left ankle and foot: Secondary | ICD-10-CM | POA: Diagnosis not present

## 2022-02-11 DIAGNOSIS — Z9889 Other specified postprocedural states: Secondary | ICD-10-CM | POA: Diagnosis not present

## 2022-02-17 DIAGNOSIS — Z20822 Contact with and (suspected) exposure to covid-19: Secondary | ICD-10-CM | POA: Diagnosis not present

## 2022-02-25 DIAGNOSIS — Z471 Aftercare following joint replacement surgery: Secondary | ICD-10-CM | POA: Diagnosis not present

## 2022-02-25 DIAGNOSIS — M25572 Pain in left ankle and joints of left foot: Secondary | ICD-10-CM | POA: Diagnosis not present

## 2022-02-25 DIAGNOSIS — M6281 Muscle weakness (generalized): Secondary | ICD-10-CM | POA: Diagnosis not present

## 2022-02-25 DIAGNOSIS — Z20822 Contact with and (suspected) exposure to covid-19: Secondary | ICD-10-CM | POA: Diagnosis not present

## 2022-02-25 DIAGNOSIS — R059 Cough, unspecified: Secondary | ICD-10-CM | POA: Diagnosis not present

## 2022-02-25 DIAGNOSIS — R051 Acute cough: Secondary | ICD-10-CM | POA: Diagnosis not present

## 2022-02-28 DIAGNOSIS — Z471 Aftercare following joint replacement surgery: Secondary | ICD-10-CM | POA: Diagnosis not present

## 2022-02-28 DIAGNOSIS — M25572 Pain in left ankle and joints of left foot: Secondary | ICD-10-CM | POA: Diagnosis not present

## 2022-02-28 DIAGNOSIS — M6281 Muscle weakness (generalized): Secondary | ICD-10-CM | POA: Diagnosis not present

## 2022-03-04 DIAGNOSIS — M25572 Pain in left ankle and joints of left foot: Secondary | ICD-10-CM | POA: Diagnosis not present

## 2022-03-04 DIAGNOSIS — Z471 Aftercare following joint replacement surgery: Secondary | ICD-10-CM | POA: Diagnosis not present

## 2022-03-04 DIAGNOSIS — M6281 Muscle weakness (generalized): Secondary | ICD-10-CM | POA: Diagnosis not present

## 2022-03-07 DIAGNOSIS — M25572 Pain in left ankle and joints of left foot: Secondary | ICD-10-CM | POA: Diagnosis not present

## 2022-03-07 DIAGNOSIS — M6281 Muscle weakness (generalized): Secondary | ICD-10-CM | POA: Diagnosis not present

## 2022-03-07 DIAGNOSIS — Z471 Aftercare following joint replacement surgery: Secondary | ICD-10-CM | POA: Diagnosis not present

## 2022-03-11 DIAGNOSIS — M25572 Pain in left ankle and joints of left foot: Secondary | ICD-10-CM | POA: Diagnosis not present

## 2022-03-11 DIAGNOSIS — M6281 Muscle weakness (generalized): Secondary | ICD-10-CM | POA: Diagnosis not present

## 2022-03-11 DIAGNOSIS — Z471 Aftercare following joint replacement surgery: Secondary | ICD-10-CM | POA: Diagnosis not present

## 2022-03-14 DIAGNOSIS — M19072 Primary osteoarthritis, left ankle and foot: Secondary | ICD-10-CM | POA: Diagnosis not present

## 2022-03-18 DIAGNOSIS — M25572 Pain in left ankle and joints of left foot: Secondary | ICD-10-CM | POA: Diagnosis not present

## 2022-03-18 DIAGNOSIS — Z471 Aftercare following joint replacement surgery: Secondary | ICD-10-CM | POA: Diagnosis not present

## 2022-03-18 DIAGNOSIS — M6281 Muscle weakness (generalized): Secondary | ICD-10-CM | POA: Diagnosis not present

## 2022-03-23 DIAGNOSIS — H353132 Nonexudative age-related macular degeneration, bilateral, intermediate dry stage: Secondary | ICD-10-CM | POA: Diagnosis not present

## 2022-03-23 DIAGNOSIS — H25813 Combined forms of age-related cataract, bilateral: Secondary | ICD-10-CM | POA: Diagnosis not present

## 2022-04-01 DIAGNOSIS — M6281 Muscle weakness (generalized): Secondary | ICD-10-CM | POA: Diagnosis not present

## 2022-04-01 DIAGNOSIS — Z471 Aftercare following joint replacement surgery: Secondary | ICD-10-CM | POA: Diagnosis not present

## 2022-04-01 DIAGNOSIS — M25572 Pain in left ankle and joints of left foot: Secondary | ICD-10-CM | POA: Diagnosis not present

## 2022-04-05 DIAGNOSIS — M48061 Spinal stenosis, lumbar region without neurogenic claudication: Secondary | ICD-10-CM | POA: Diagnosis not present

## 2022-04-18 DIAGNOSIS — M47816 Spondylosis without myelopathy or radiculopathy, lumbar region: Secondary | ICD-10-CM | POA: Diagnosis not present

## 2022-04-22 DIAGNOSIS — Z471 Aftercare following joint replacement surgery: Secondary | ICD-10-CM | POA: Diagnosis not present

## 2022-04-22 DIAGNOSIS — M25572 Pain in left ankle and joints of left foot: Secondary | ICD-10-CM | POA: Diagnosis not present

## 2022-04-22 DIAGNOSIS — M6281 Muscle weakness (generalized): Secondary | ICD-10-CM | POA: Diagnosis not present

## 2022-05-11 DIAGNOSIS — M47816 Spondylosis without myelopathy or radiculopathy, lumbar region: Secondary | ICD-10-CM | POA: Diagnosis not present

## 2022-05-20 DIAGNOSIS — M47816 Spondylosis without myelopathy or radiculopathy, lumbar region: Secondary | ICD-10-CM | POA: Diagnosis not present

## 2022-06-08 DIAGNOSIS — M47816 Spondylosis without myelopathy or radiculopathy, lumbar region: Secondary | ICD-10-CM | POA: Diagnosis not present

## 2022-06-13 DIAGNOSIS — N486 Induration penis plastica: Secondary | ICD-10-CM | POA: Diagnosis not present

## 2022-06-13 DIAGNOSIS — F5221 Male erectile disorder: Secondary | ICD-10-CM | POA: Diagnosis not present

## 2022-06-13 DIAGNOSIS — R351 Nocturia: Secondary | ICD-10-CM | POA: Diagnosis not present

## 2022-06-13 DIAGNOSIS — N138 Other obstructive and reflux uropathy: Secondary | ICD-10-CM | POA: Diagnosis not present

## 2022-06-13 DIAGNOSIS — N401 Enlarged prostate with lower urinary tract symptoms: Secondary | ICD-10-CM | POA: Diagnosis not present

## 2022-06-13 DIAGNOSIS — N503 Cyst of epididymis: Secondary | ICD-10-CM | POA: Diagnosis not present

## 2022-06-29 DIAGNOSIS — M47816 Spondylosis without myelopathy or radiculopathy, lumbar region: Secondary | ICD-10-CM | POA: Diagnosis not present

## 2022-08-05 DIAGNOSIS — M47816 Spondylosis without myelopathy or radiculopathy, lumbar region: Secondary | ICD-10-CM | POA: Diagnosis not present

## 2022-08-17 DIAGNOSIS — M19072 Primary osteoarthritis, left ankle and foot: Secondary | ICD-10-CM | POA: Diagnosis not present

## 2022-08-17 DIAGNOSIS — E7849 Other hyperlipidemia: Secondary | ICD-10-CM | POA: Diagnosis not present

## 2022-08-17 DIAGNOSIS — B179 Acute viral hepatitis, unspecified: Secondary | ICD-10-CM | POA: Diagnosis not present

## 2022-08-17 DIAGNOSIS — M109 Gout, unspecified: Secondary | ICD-10-CM | POA: Diagnosis not present

## 2022-08-17 DIAGNOSIS — Z6827 Body mass index (BMI) 27.0-27.9, adult: Secondary | ICD-10-CM | POA: Diagnosis not present

## 2022-08-17 DIAGNOSIS — E039 Hypothyroidism, unspecified: Secondary | ICD-10-CM | POA: Diagnosis not present

## 2022-08-17 DIAGNOSIS — K5790 Diverticulosis of intestine, part unspecified, without perforation or abscess without bleeding: Secondary | ICD-10-CM | POA: Diagnosis not present

## 2022-08-17 DIAGNOSIS — M5136 Other intervertebral disc degeneration, lumbar region: Secondary | ICD-10-CM | POA: Diagnosis not present

## 2022-08-17 DIAGNOSIS — Z9221 Personal history of antineoplastic chemotherapy: Secondary | ICD-10-CM | POA: Diagnosis not present

## 2022-08-17 DIAGNOSIS — I10 Essential (primary) hypertension: Secondary | ICD-10-CM | POA: Diagnosis not present

## 2022-08-17 DIAGNOSIS — Z8572 Personal history of non-Hodgkin lymphomas: Secondary | ICD-10-CM | POA: Diagnosis not present

## 2022-08-17 DIAGNOSIS — E782 Mixed hyperlipidemia: Secondary | ICD-10-CM | POA: Diagnosis not present

## 2022-08-29 DIAGNOSIS — L570 Actinic keratosis: Secondary | ICD-10-CM | POA: Diagnosis not present

## 2022-08-29 DIAGNOSIS — Z1283 Encounter for screening for malignant neoplasm of skin: Secondary | ICD-10-CM | POA: Diagnosis not present

## 2022-08-29 DIAGNOSIS — D239 Other benign neoplasm of skin, unspecified: Secondary | ICD-10-CM | POA: Diagnosis not present

## 2022-08-31 DIAGNOSIS — E871 Hypo-osmolality and hyponatremia: Secondary | ICD-10-CM | POA: Diagnosis not present

## 2022-09-01 DIAGNOSIS — M47816 Spondylosis without myelopathy or radiculopathy, lumbar region: Secondary | ICD-10-CM | POA: Diagnosis not present

## 2022-09-10 DIAGNOSIS — M25572 Pain in left ankle and joints of left foot: Secondary | ICD-10-CM | POA: Diagnosis not present

## 2022-09-10 DIAGNOSIS — M1711 Unilateral primary osteoarthritis, right knee: Secondary | ICD-10-CM | POA: Diagnosis not present

## 2022-09-21 DIAGNOSIS — E871 Hypo-osmolality and hyponatremia: Secondary | ICD-10-CM | POA: Diagnosis not present

## 2022-09-21 DIAGNOSIS — K219 Gastro-esophageal reflux disease without esophagitis: Secondary | ICD-10-CM | POA: Diagnosis not present

## 2022-09-21 DIAGNOSIS — I10 Essential (primary) hypertension: Secondary | ICD-10-CM | POA: Diagnosis not present

## 2022-09-26 DIAGNOSIS — M48061 Spinal stenosis, lumbar region without neurogenic claudication: Secondary | ICD-10-CM | POA: Diagnosis not present

## 2022-09-26 DIAGNOSIS — E7849 Other hyperlipidemia: Secondary | ICD-10-CM | POA: Diagnosis not present

## 2022-09-26 DIAGNOSIS — I10 Essential (primary) hypertension: Secondary | ICD-10-CM | POA: Diagnosis not present

## 2022-09-26 DIAGNOSIS — Z8673 Personal history of transient ischemic attack (TIA), and cerebral infarction without residual deficits: Secondary | ICD-10-CM | POA: Diagnosis not present

## 2022-09-26 DIAGNOSIS — Z1389 Encounter for screening for other disorder: Secondary | ICD-10-CM | POA: Diagnosis not present

## 2022-09-26 DIAGNOSIS — E871 Hypo-osmolality and hyponatremia: Secondary | ICD-10-CM | POA: Diagnosis not present

## 2022-09-26 DIAGNOSIS — Z1331 Encounter for screening for depression: Secondary | ICD-10-CM | POA: Diagnosis not present

## 2022-09-26 DIAGNOSIS — Z6827 Body mass index (BMI) 27.0-27.9, adult: Secondary | ICD-10-CM | POA: Diagnosis not present

## 2022-09-27 DIAGNOSIS — Z23 Encounter for immunization: Secondary | ICD-10-CM | POA: Diagnosis not present

## 2022-09-27 DIAGNOSIS — M1711 Unilateral primary osteoarthritis, right knee: Secondary | ICD-10-CM | POA: Diagnosis not present

## 2022-09-29 DIAGNOSIS — Z96651 Presence of right artificial knee joint: Secondary | ICD-10-CM | POA: Diagnosis not present

## 2022-09-29 DIAGNOSIS — M25561 Pain in right knee: Secondary | ICD-10-CM | POA: Diagnosis not present

## 2022-10-07 DIAGNOSIS — M7061 Trochanteric bursitis, right hip: Secondary | ICD-10-CM | POA: Diagnosis not present

## 2022-12-21 DIAGNOSIS — E7849 Other hyperlipidemia: Secondary | ICD-10-CM | POA: Diagnosis not present

## 2022-12-21 DIAGNOSIS — E871 Hypo-osmolality and hyponatremia: Secondary | ICD-10-CM | POA: Diagnosis not present

## 2022-12-21 DIAGNOSIS — I10 Essential (primary) hypertension: Secondary | ICD-10-CM | POA: Diagnosis not present

## 2022-12-21 DIAGNOSIS — K219 Gastro-esophageal reflux disease without esophagitis: Secondary | ICD-10-CM | POA: Diagnosis not present

## 2022-12-28 DIAGNOSIS — Z8673 Personal history of transient ischemic attack (TIA), and cerebral infarction without residual deficits: Secondary | ICD-10-CM | POA: Diagnosis not present

## 2022-12-28 DIAGNOSIS — I10 Essential (primary) hypertension: Secondary | ICD-10-CM | POA: Diagnosis not present

## 2022-12-28 DIAGNOSIS — R4789 Other speech disturbances: Secondary | ICD-10-CM | POA: Diagnosis not present

## 2022-12-28 DIAGNOSIS — Z6827 Body mass index (BMI) 27.0-27.9, adult: Secondary | ICD-10-CM | POA: Diagnosis not present

## 2022-12-28 DIAGNOSIS — E871 Hypo-osmolality and hyponatremia: Secondary | ICD-10-CM | POA: Diagnosis not present

## 2022-12-28 DIAGNOSIS — E7849 Other hyperlipidemia: Secondary | ICD-10-CM | POA: Diagnosis not present

## 2022-12-28 DIAGNOSIS — F40240 Claustrophobia: Secondary | ICD-10-CM | POA: Diagnosis not present

## 2023-01-04 DIAGNOSIS — R410 Disorientation, unspecified: Secondary | ICD-10-CM | POA: Diagnosis not present

## 2023-01-04 DIAGNOSIS — I6789 Other cerebrovascular disease: Secondary | ICD-10-CM | POA: Diagnosis not present

## 2023-01-13 DIAGNOSIS — M47816 Spondylosis without myelopathy or radiculopathy, lumbar region: Secondary | ICD-10-CM | POA: Diagnosis not present

## 2023-01-24 DIAGNOSIS — M47816 Spondylosis without myelopathy or radiculopathy, lumbar region: Secondary | ICD-10-CM | POA: Diagnosis not present

## 2023-02-24 DIAGNOSIS — M7631 Iliotibial band syndrome, right leg: Secondary | ICD-10-CM | POA: Diagnosis not present

## 2023-02-28 DIAGNOSIS — M7631 Iliotibial band syndrome, right leg: Secondary | ICD-10-CM | POA: Diagnosis not present

## 2023-03-02 DIAGNOSIS — M7631 Iliotibial band syndrome, right leg: Secondary | ICD-10-CM | POA: Diagnosis not present

## 2023-03-07 DIAGNOSIS — M7631 Iliotibial band syndrome, right leg: Secondary | ICD-10-CM | POA: Diagnosis not present

## 2023-03-08 DIAGNOSIS — M533 Sacrococcygeal disorders, not elsewhere classified: Secondary | ICD-10-CM | POA: Diagnosis not present

## 2023-03-10 DIAGNOSIS — M7631 Iliotibial band syndrome, right leg: Secondary | ICD-10-CM | POA: Diagnosis not present

## 2023-03-14 DIAGNOSIS — M7631 Iliotibial band syndrome, right leg: Secondary | ICD-10-CM | POA: Diagnosis not present

## 2023-03-17 DIAGNOSIS — M7631 Iliotibial band syndrome, right leg: Secondary | ICD-10-CM | POA: Diagnosis not present

## 2023-03-21 DIAGNOSIS — M533 Sacrococcygeal disorders, not elsewhere classified: Secondary | ICD-10-CM | POA: Diagnosis not present

## 2023-03-22 DIAGNOSIS — M7631 Iliotibial band syndrome, right leg: Secondary | ICD-10-CM | POA: Diagnosis not present

## 2023-03-23 DIAGNOSIS — M810 Age-related osteoporosis without current pathological fracture: Secondary | ICD-10-CM | POA: Diagnosis not present

## 2023-03-23 DIAGNOSIS — M8580 Other specified disorders of bone density and structure, unspecified site: Secondary | ICD-10-CM | POA: Diagnosis not present

## 2023-03-23 DIAGNOSIS — M5459 Other low back pain: Secondary | ICD-10-CM | POA: Diagnosis not present

## 2023-03-24 DIAGNOSIS — M7631 Iliotibial band syndrome, right leg: Secondary | ICD-10-CM | POA: Diagnosis not present

## 2023-03-29 DIAGNOSIS — H25813 Combined forms of age-related cataract, bilateral: Secondary | ICD-10-CM | POA: Diagnosis not present

## 2023-03-29 DIAGNOSIS — H353132 Nonexudative age-related macular degeneration, bilateral, intermediate dry stage: Secondary | ICD-10-CM | POA: Diagnosis not present

## 2023-04-04 DIAGNOSIS — M545 Low back pain, unspecified: Secondary | ICD-10-CM | POA: Diagnosis not present

## 2023-04-04 DIAGNOSIS — M7631 Iliotibial band syndrome, right leg: Secondary | ICD-10-CM | POA: Diagnosis not present

## 2023-04-11 DIAGNOSIS — M545 Low back pain, unspecified: Secondary | ICD-10-CM | POA: Diagnosis not present

## 2023-04-11 DIAGNOSIS — M7631 Iliotibial band syndrome, right leg: Secondary | ICD-10-CM | POA: Diagnosis not present

## 2023-04-13 DIAGNOSIS — M48062 Spinal stenosis, lumbar region with neurogenic claudication: Secondary | ICD-10-CM | POA: Diagnosis not present

## 2023-04-14 DIAGNOSIS — M8580 Other specified disorders of bone density and structure, unspecified site: Secondary | ICD-10-CM | POA: Diagnosis not present

## 2023-04-14 DIAGNOSIS — M461 Sacroiliitis, not elsewhere classified: Secondary | ICD-10-CM | POA: Diagnosis not present

## 2023-04-14 DIAGNOSIS — M545 Low back pain, unspecified: Secondary | ICD-10-CM | POA: Diagnosis not present

## 2023-04-14 DIAGNOSIS — M7631 Iliotibial band syndrome, right leg: Secondary | ICD-10-CM | POA: Diagnosis not present

## 2023-05-01 DIAGNOSIS — E871 Hypo-osmolality and hyponatremia: Secondary | ICD-10-CM | POA: Diagnosis not present

## 2023-05-01 DIAGNOSIS — I10 Essential (primary) hypertension: Secondary | ICD-10-CM | POA: Diagnosis not present

## 2023-05-01 DIAGNOSIS — E559 Vitamin D deficiency, unspecified: Secondary | ICD-10-CM | POA: Diagnosis not present

## 2023-05-01 DIAGNOSIS — Z1329 Encounter for screening for other suspected endocrine disorder: Secondary | ICD-10-CM | POA: Diagnosis not present

## 2023-05-01 DIAGNOSIS — K219 Gastro-esophageal reflux disease without esophagitis: Secondary | ICD-10-CM | POA: Diagnosis not present

## 2023-05-02 DIAGNOSIS — M545 Low back pain, unspecified: Secondary | ICD-10-CM | POA: Diagnosis not present

## 2023-05-02 DIAGNOSIS — M7631 Iliotibial band syndrome, right leg: Secondary | ICD-10-CM | POA: Diagnosis not present

## 2023-05-05 ENCOUNTER — Other Ambulatory Visit: Payer: Self-pay | Admitting: Physician Assistant

## 2023-05-05 DIAGNOSIS — M810 Age-related osteoporosis without current pathological fracture: Secondary | ICD-10-CM

## 2023-05-05 DIAGNOSIS — M461 Sacroiliitis, not elsewhere classified: Secondary | ICD-10-CM

## 2023-05-05 DIAGNOSIS — M545 Low back pain, unspecified: Secondary | ICD-10-CM

## 2023-05-08 ENCOUNTER — Ambulatory Visit
Admission: RE | Admit: 2023-05-08 | Discharge: 2023-05-08 | Disposition: A | Discharge status: Home / Self Care | Payer: Medicare Other | Source: Ambulatory Visit | Attending: Physician Assistant | Admitting: Physician Assistant

## 2023-05-08 DIAGNOSIS — M461 Sacroiliitis, not elsewhere classified: Secondary | ICD-10-CM

## 2023-05-08 DIAGNOSIS — M545 Low back pain, unspecified: Secondary | ICD-10-CM

## 2023-05-08 DIAGNOSIS — M48061 Spinal stenosis, lumbar region without neurogenic claudication: Secondary | ICD-10-CM | POA: Diagnosis not present

## 2023-05-08 DIAGNOSIS — M810 Age-related osteoporosis without current pathological fracture: Secondary | ICD-10-CM

## 2023-05-08 DIAGNOSIS — M47816 Spondylosis without myelopathy or radiculopathy, lumbar region: Secondary | ICD-10-CM | POA: Diagnosis not present

## 2023-05-08 DIAGNOSIS — M5126 Other intervertebral disc displacement, lumbar region: Secondary | ICD-10-CM | POA: Diagnosis not present

## 2023-05-09 DIAGNOSIS — M7631 Iliotibial band syndrome, right leg: Secondary | ICD-10-CM | POA: Diagnosis not present

## 2023-05-09 DIAGNOSIS — M545 Low back pain, unspecified: Secondary | ICD-10-CM | POA: Diagnosis not present

## 2023-05-10 DIAGNOSIS — M160 Bilateral primary osteoarthritis of hip: Secondary | ICD-10-CM | POA: Diagnosis not present

## 2023-05-10 DIAGNOSIS — M7918 Myalgia, other site: Secondary | ICD-10-CM | POA: Diagnosis not present

## 2023-05-10 DIAGNOSIS — M5136 Other intervertebral disc degeneration, lumbar region: Secondary | ICD-10-CM | POA: Diagnosis not present

## 2023-05-10 DIAGNOSIS — M545 Low back pain, unspecified: Secondary | ICD-10-CM | POA: Diagnosis not present

## 2023-05-10 DIAGNOSIS — M48061 Spinal stenosis, lumbar region without neurogenic claudication: Secondary | ICD-10-CM | POA: Diagnosis not present

## 2023-05-11 DIAGNOSIS — M48061 Spinal stenosis, lumbar region without neurogenic claudication: Secondary | ICD-10-CM | POA: Diagnosis not present

## 2023-05-11 DIAGNOSIS — I10 Essential (primary) hypertension: Secondary | ICD-10-CM | POA: Diagnosis not present

## 2023-05-11 DIAGNOSIS — E7849 Other hyperlipidemia: Secondary | ICD-10-CM | POA: Diagnosis not present

## 2023-05-11 DIAGNOSIS — Z6827 Body mass index (BMI) 27.0-27.9, adult: Secondary | ICD-10-CM | POA: Diagnosis not present

## 2023-05-11 DIAGNOSIS — E871 Hypo-osmolality and hyponatremia: Secondary | ICD-10-CM | POA: Diagnosis not present

## 2023-05-16 DIAGNOSIS — M7631 Iliotibial band syndrome, right leg: Secondary | ICD-10-CM | POA: Diagnosis not present

## 2023-05-16 DIAGNOSIS — M545 Low back pain, unspecified: Secondary | ICD-10-CM | POA: Diagnosis not present

## 2023-05-18 DIAGNOSIS — M545 Low back pain, unspecified: Secondary | ICD-10-CM | POA: Diagnosis not present

## 2023-05-18 DIAGNOSIS — M7631 Iliotibial band syndrome, right leg: Secondary | ICD-10-CM | POA: Diagnosis not present

## 2023-05-19 DIAGNOSIS — M48061 Spinal stenosis, lumbar region without neurogenic claudication: Secondary | ICD-10-CM | POA: Diagnosis not present

## 2023-05-19 DIAGNOSIS — E785 Hyperlipidemia, unspecified: Secondary | ICD-10-CM | POA: Diagnosis not present

## 2023-05-19 DIAGNOSIS — I10 Essential (primary) hypertension: Secondary | ICD-10-CM | POA: Diagnosis not present

## 2023-05-19 DIAGNOSIS — Z01818 Encounter for other preprocedural examination: Secondary | ICD-10-CM | POA: Diagnosis not present

## 2023-05-20 DIAGNOSIS — Z01810 Encounter for preprocedural cardiovascular examination: Secondary | ICD-10-CM | POA: Diagnosis not present

## 2023-05-21 ENCOUNTER — Other Ambulatory Visit: Payer: Medicare Other

## 2023-05-23 DIAGNOSIS — M545 Low back pain, unspecified: Secondary | ICD-10-CM | POA: Diagnosis not present

## 2023-05-23 DIAGNOSIS — M7631 Iliotibial band syndrome, right leg: Secondary | ICD-10-CM | POA: Diagnosis not present

## 2023-05-25 DIAGNOSIS — N182 Chronic kidney disease, stage 2 (mild): Secondary | ICD-10-CM | POA: Diagnosis not present

## 2023-05-25 DIAGNOSIS — I129 Hypertensive chronic kidney disease with stage 1 through stage 4 chronic kidney disease, or unspecified chronic kidney disease: Secondary | ICD-10-CM | POA: Diagnosis not present

## 2023-05-25 DIAGNOSIS — M545 Low back pain, unspecified: Secondary | ICD-10-CM | POA: Diagnosis not present

## 2023-05-25 DIAGNOSIS — M2578 Osteophyte, vertebrae: Secondary | ICD-10-CM | POA: Diagnosis not present

## 2023-05-25 DIAGNOSIS — M48061 Spinal stenosis, lumbar region without neurogenic claudication: Secondary | ICD-10-CM | POA: Diagnosis not present

## 2023-05-25 DIAGNOSIS — K219 Gastro-esophageal reflux disease without esophagitis: Secondary | ICD-10-CM | POA: Diagnosis not present

## 2023-05-25 DIAGNOSIS — Z79899 Other long term (current) drug therapy: Secondary | ICD-10-CM | POA: Diagnosis not present

## 2023-05-25 DIAGNOSIS — M7918 Myalgia, other site: Secondary | ICD-10-CM | POA: Diagnosis not present

## 2023-05-25 DIAGNOSIS — N400 Enlarged prostate without lower urinary tract symptoms: Secondary | ICD-10-CM | POA: Diagnosis not present

## 2023-05-25 DIAGNOSIS — Z886 Allergy status to analgesic agent status: Secondary | ICD-10-CM | POA: Diagnosis not present

## 2023-05-25 DIAGNOSIS — M4316 Spondylolisthesis, lumbar region: Secondary | ICD-10-CM | POA: Diagnosis not present

## 2023-05-25 DIAGNOSIS — E785 Hyperlipidemia, unspecified: Secondary | ICD-10-CM | POA: Diagnosis not present

## 2023-05-25 DIAGNOSIS — Z8673 Personal history of transient ischemic attack (TIA), and cerebral infarction without residual deficits: Secondary | ICD-10-CM | POA: Diagnosis not present

## 2023-05-25 DIAGNOSIS — M109 Gout, unspecified: Secondary | ICD-10-CM | POA: Diagnosis not present

## 2023-05-25 DIAGNOSIS — Z8572 Personal history of non-Hodgkin lymphomas: Secondary | ICD-10-CM | POA: Diagnosis not present

## 2023-05-25 DIAGNOSIS — Z7982 Long term (current) use of aspirin: Secondary | ICD-10-CM | POA: Diagnosis not present

## 2023-05-26 DIAGNOSIS — I129 Hypertensive chronic kidney disease with stage 1 through stage 4 chronic kidney disease, or unspecified chronic kidney disease: Secondary | ICD-10-CM | POA: Diagnosis not present

## 2023-05-26 DIAGNOSIS — M545 Low back pain, unspecified: Secondary | ICD-10-CM | POA: Diagnosis not present

## 2023-05-26 DIAGNOSIS — M7918 Myalgia, other site: Secondary | ICD-10-CM | POA: Diagnosis not present

## 2023-05-26 DIAGNOSIS — N182 Chronic kidney disease, stage 2 (mild): Secondary | ICD-10-CM | POA: Diagnosis not present

## 2023-05-26 DIAGNOSIS — M48061 Spinal stenosis, lumbar region without neurogenic claudication: Secondary | ICD-10-CM | POA: Diagnosis not present

## 2023-05-26 DIAGNOSIS — M4316 Spondylolisthesis, lumbar region: Secondary | ICD-10-CM | POA: Diagnosis not present

## 2023-06-14 DIAGNOSIS — Z4889 Encounter for other specified surgical aftercare: Secondary | ICD-10-CM | POA: Diagnosis not present

## 2023-06-14 DIAGNOSIS — Z9889 Other specified postprocedural states: Secondary | ICD-10-CM | POA: Diagnosis not present

## 2023-07-05 DIAGNOSIS — Z9889 Other specified postprocedural states: Secondary | ICD-10-CM | POA: Diagnosis not present

## 2023-07-05 DIAGNOSIS — M5136 Other intervertebral disc degeneration, lumbar region: Secondary | ICD-10-CM | POA: Diagnosis not present

## 2023-07-19 DIAGNOSIS — N138 Other obstructive and reflux uropathy: Secondary | ICD-10-CM | POA: Diagnosis not present

## 2023-07-19 DIAGNOSIS — R351 Nocturia: Secondary | ICD-10-CM | POA: Diagnosis not present

## 2023-07-19 DIAGNOSIS — N503 Cyst of epididymis: Secondary | ICD-10-CM | POA: Diagnosis not present

## 2023-07-19 DIAGNOSIS — N401 Enlarged prostate with lower urinary tract symptoms: Secondary | ICD-10-CM | POA: Diagnosis not present

## 2023-07-19 DIAGNOSIS — N486 Induration penis plastica: Secondary | ICD-10-CM | POA: Diagnosis not present

## 2023-07-19 DIAGNOSIS — F5221 Male erectile disorder: Secondary | ICD-10-CM | POA: Diagnosis not present

## 2023-08-10 DIAGNOSIS — D649 Anemia, unspecified: Secondary | ICD-10-CM | POA: Diagnosis not present

## 2023-08-10 DIAGNOSIS — Z125 Encounter for screening for malignant neoplasm of prostate: Secondary | ICD-10-CM | POA: Diagnosis not present

## 2023-08-10 DIAGNOSIS — D519 Vitamin B12 deficiency anemia, unspecified: Secondary | ICD-10-CM | POA: Diagnosis not present

## 2023-08-10 DIAGNOSIS — E871 Hypo-osmolality and hyponatremia: Secondary | ICD-10-CM | POA: Diagnosis not present

## 2023-08-10 DIAGNOSIS — D529 Folate deficiency anemia, unspecified: Secondary | ICD-10-CM | POA: Diagnosis not present

## 2023-08-17 DIAGNOSIS — Z23 Encounter for immunization: Secondary | ICD-10-CM | POA: Diagnosis not present

## 2023-08-17 DIAGNOSIS — M48061 Spinal stenosis, lumbar region without neurogenic claudication: Secondary | ICD-10-CM | POA: Diagnosis not present

## 2023-08-17 DIAGNOSIS — R200 Anesthesia of skin: Secondary | ICD-10-CM | POA: Diagnosis not present

## 2023-08-17 DIAGNOSIS — Z6826 Body mass index (BMI) 26.0-26.9, adult: Secondary | ICD-10-CM | POA: Diagnosis not present

## 2023-08-17 DIAGNOSIS — I10 Essential (primary) hypertension: Secondary | ICD-10-CM | POA: Diagnosis not present

## 2023-08-17 DIAGNOSIS — G9782 Other postprocedural complications and disorders of nervous system: Secondary | ICD-10-CM | POA: Diagnosis not present

## 2023-08-17 DIAGNOSIS — E7849 Other hyperlipidemia: Secondary | ICD-10-CM | POA: Diagnosis not present

## 2023-08-17 DIAGNOSIS — E871 Hypo-osmolality and hyponatremia: Secondary | ICD-10-CM | POA: Diagnosis not present

## 2023-08-21 DIAGNOSIS — M5459 Other low back pain: Secondary | ICD-10-CM | POA: Diagnosis not present

## 2023-08-21 DIAGNOSIS — R2689 Other abnormalities of gait and mobility: Secondary | ICD-10-CM | POA: Diagnosis not present

## 2023-08-21 DIAGNOSIS — Z9889 Other specified postprocedural states: Secondary | ICD-10-CM | POA: Diagnosis not present

## 2023-08-24 DIAGNOSIS — M5459 Other low back pain: Secondary | ICD-10-CM | POA: Diagnosis not present

## 2023-08-24 DIAGNOSIS — R2689 Other abnormalities of gait and mobility: Secondary | ICD-10-CM | POA: Diagnosis not present

## 2023-08-24 DIAGNOSIS — Z9889 Other specified postprocedural states: Secondary | ICD-10-CM | POA: Diagnosis not present

## 2023-08-31 DIAGNOSIS — R2689 Other abnormalities of gait and mobility: Secondary | ICD-10-CM | POA: Diagnosis not present

## 2023-08-31 DIAGNOSIS — M5459 Other low back pain: Secondary | ICD-10-CM | POA: Diagnosis not present

## 2023-08-31 DIAGNOSIS — Z9889 Other specified postprocedural states: Secondary | ICD-10-CM | POA: Diagnosis not present

## 2023-09-07 DIAGNOSIS — R2689 Other abnormalities of gait and mobility: Secondary | ICD-10-CM | POA: Diagnosis not present

## 2023-09-07 DIAGNOSIS — M5459 Other low back pain: Secondary | ICD-10-CM | POA: Diagnosis not present

## 2023-09-13 DIAGNOSIS — M25572 Pain in left ankle and joints of left foot: Secondary | ICD-10-CM | POA: Diagnosis not present

## 2023-09-14 DIAGNOSIS — M5459 Other low back pain: Secondary | ICD-10-CM | POA: Diagnosis not present

## 2023-09-14 DIAGNOSIS — R2689 Other abnormalities of gait and mobility: Secondary | ICD-10-CM | POA: Diagnosis not present

## 2023-09-21 DIAGNOSIS — M5459 Other low back pain: Secondary | ICD-10-CM | POA: Diagnosis not present

## 2023-09-21 DIAGNOSIS — R2689 Other abnormalities of gait and mobility: Secondary | ICD-10-CM | POA: Diagnosis not present

## 2023-10-30 DIAGNOSIS — D1801 Hemangioma of skin and subcutaneous tissue: Secondary | ICD-10-CM | POA: Diagnosis not present

## 2023-10-30 DIAGNOSIS — L821 Other seborrheic keratosis: Secondary | ICD-10-CM | POA: Diagnosis not present

## 2023-10-30 DIAGNOSIS — Z1283 Encounter for screening for malignant neoplasm of skin: Secondary | ICD-10-CM | POA: Diagnosis not present

## 2023-10-30 DIAGNOSIS — L570 Actinic keratosis: Secondary | ICD-10-CM | POA: Diagnosis not present

## 2023-11-02 DIAGNOSIS — G629 Polyneuropathy, unspecified: Secondary | ICD-10-CM | POA: Diagnosis not present

## 2023-11-02 DIAGNOSIS — Z0000 Encounter for general adult medical examination without abnormal findings: Secondary | ICD-10-CM | POA: Diagnosis not present

## 2023-11-02 DIAGNOSIS — E785 Hyperlipidemia, unspecified: Secondary | ICD-10-CM | POA: Diagnosis not present

## 2023-11-02 DIAGNOSIS — K219 Gastro-esophageal reflux disease without esophagitis: Secondary | ICD-10-CM | POA: Diagnosis not present

## 2023-11-02 DIAGNOSIS — I10 Essential (primary) hypertension: Secondary | ICD-10-CM | POA: Diagnosis not present

## 2023-11-04 IMAGING — RF DG ANKLE COMPLETE 3+V*L*
1 series · 3 of 3 positions shown · non-contrast
Comparison: 03/04/2021

CLINICAL DATA: Status post left ankle arthroplasty

EXAM:
LEFT ANKLE COMPLETE - 3+ VIEW

[Series 1: run · 3 of 3 slices shown]
[im 1/3]
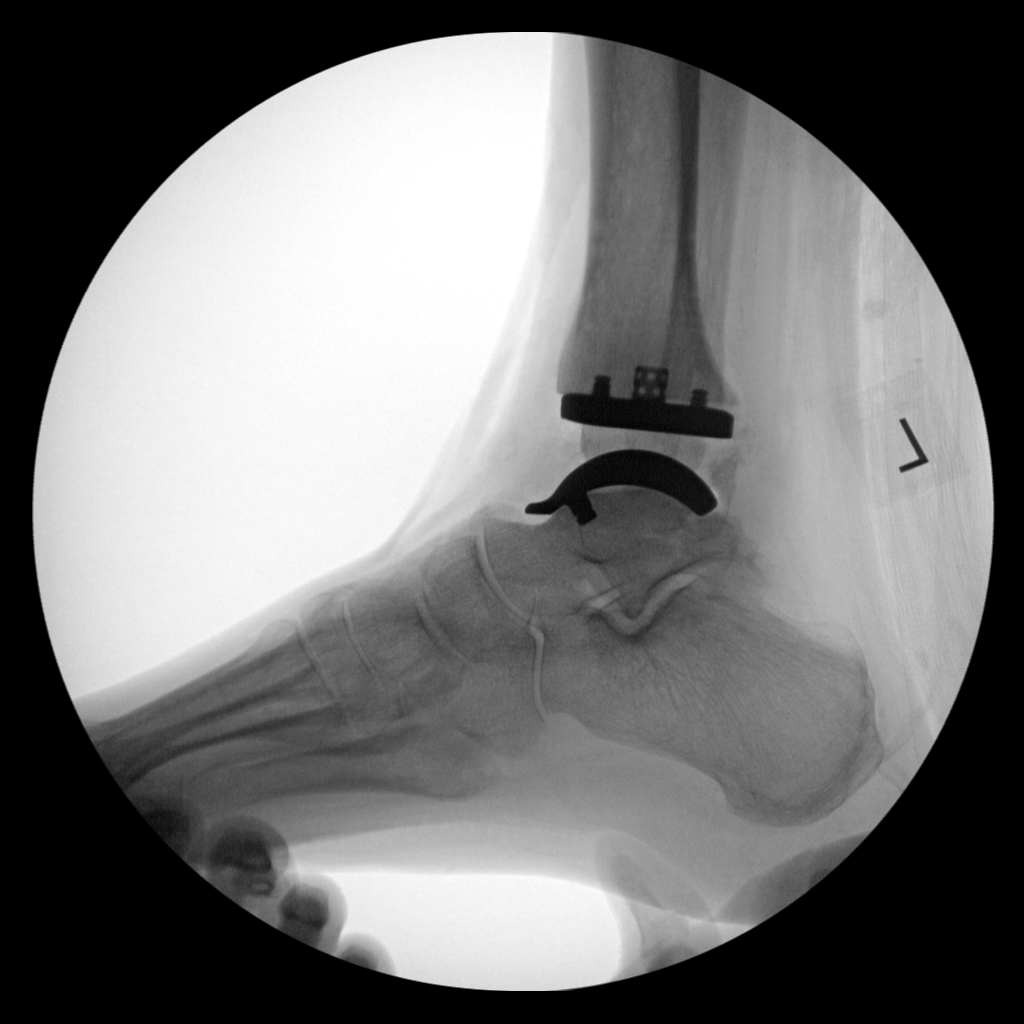
[im 2/3]
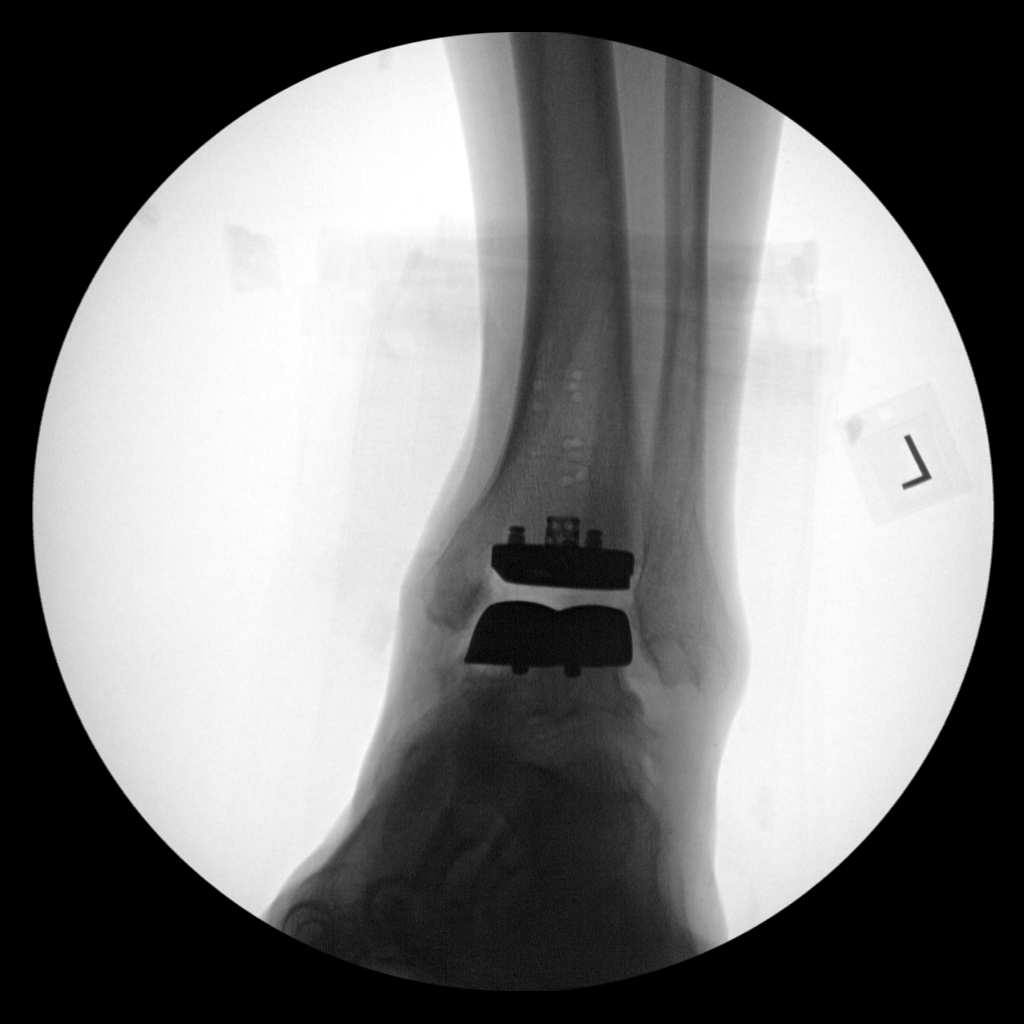
[im 3/3]
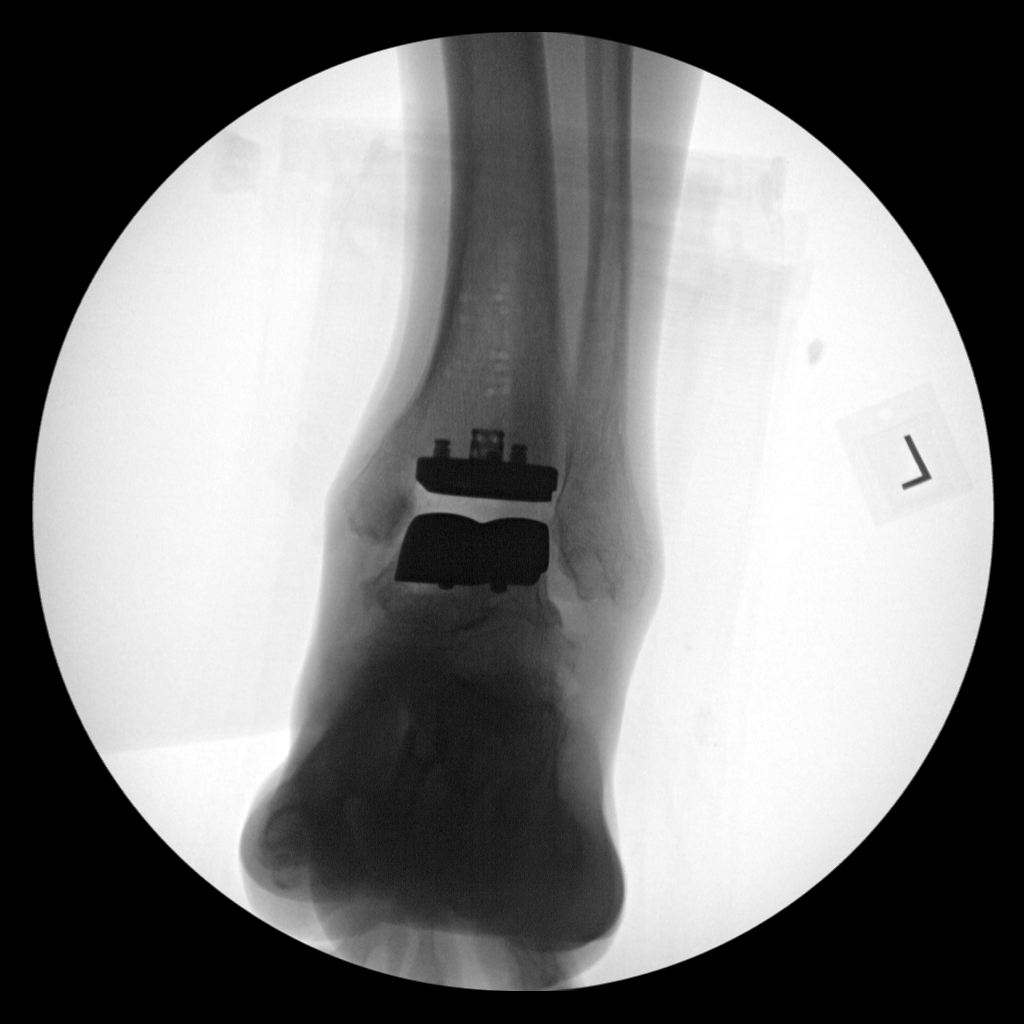

[3 of 3 positions shown; findings below may reference images not displayed]

FINDINGS: Fluoroscopic images show interval left ankle arthroplasty. No
fracture is seen. Fluoroscopic time was 101 seconds. Estimated
radiation dose is 2.44 mGy.
IMPRESSION: Fluoroscopic assistance was provided for left ankle arthroplasty.

## 2023-11-09 ENCOUNTER — Encounter: Payer: Self-pay | Admitting: *Deleted

## 2024-03-06 DIAGNOSIS — M25572 Pain in left ankle and joints of left foot: Secondary | ICD-10-CM | POA: Diagnosis not present

## 2024-03-13 DIAGNOSIS — G629 Polyneuropathy, unspecified: Secondary | ICD-10-CM | POA: Diagnosis not present

## 2024-03-13 DIAGNOSIS — R202 Paresthesia of skin: Secondary | ICD-10-CM | POA: Diagnosis not present

## 2024-04-17 DIAGNOSIS — Z9889 Other specified postprocedural states: Secondary | ICD-10-CM | POA: Diagnosis not present

## 2024-04-17 DIAGNOSIS — G629 Polyneuropathy, unspecified: Secondary | ICD-10-CM | POA: Diagnosis not present

## 2024-07-15 DIAGNOSIS — H25813 Combined forms of age-related cataract, bilateral: Secondary | ICD-10-CM | POA: Diagnosis not present

## 2024-07-15 DIAGNOSIS — H353132 Nonexudative age-related macular degeneration, bilateral, intermediate dry stage: Secondary | ICD-10-CM | POA: Diagnosis not present

## 2024-08-06 DIAGNOSIS — M1A09X0 Idiopathic chronic gout, multiple sites, without tophus (tophi): Secondary | ICD-10-CM | POA: Diagnosis not present

## 2024-08-06 DIAGNOSIS — K219 Gastro-esophageal reflux disease without esophagitis: Secondary | ICD-10-CM | POA: Diagnosis not present

## 2024-08-06 DIAGNOSIS — E782 Mixed hyperlipidemia: Secondary | ICD-10-CM | POA: Diagnosis not present

## 2024-08-06 DIAGNOSIS — I10 Essential (primary) hypertension: Secondary | ICD-10-CM | POA: Diagnosis not present

## 2024-08-06 DIAGNOSIS — R351 Nocturia: Secondary | ICD-10-CM | POA: Diagnosis not present

## 2024-09-30 DIAGNOSIS — H6121 Impacted cerumen, right ear: Secondary | ICD-10-CM | POA: Diagnosis not present
